# Patient Record
Sex: Male | Born: 1955 | Race: Black or African American | Hispanic: No | Marital: Married | Smoking: Current every day smoker
Health system: Southern US, Community
[De-identification: ages and names within clinical notes are randomized; demographics above are authoritative.]

## PROBLEM LIST (undated history)

## (undated) DIAGNOSIS — J45909 Unspecified asthma, uncomplicated: Secondary | ICD-10-CM

## (undated) DIAGNOSIS — E785 Hyperlipidemia, unspecified: Secondary | ICD-10-CM

## (undated) DIAGNOSIS — I10 Essential (primary) hypertension: Secondary | ICD-10-CM

## (undated) DIAGNOSIS — R0600 Dyspnea, unspecified: Secondary | ICD-10-CM

## (undated) HISTORY — DX: Hyperlipidemia, unspecified: E78.5

## (undated) HISTORY — DX: Unspecified asthma, uncomplicated: J45.909

---

## 2019-09-15 ENCOUNTER — Encounter: Payer: Self-pay | Admitting: Family Medicine

## 2019-09-15 ENCOUNTER — Other Ambulatory Visit: Payer: Self-pay

## 2019-09-15 ENCOUNTER — Ambulatory Visit: Payer: Self-pay | Admitting: Family Medicine

## 2019-09-15 DIAGNOSIS — Z5321 Procedure and treatment not carried out due to patient leaving prior to being seen by health care provider: Secondary | ICD-10-CM

## 2019-09-15 NOTE — Progress Notes (Signed)
Here today for STD screening. Accepts bloodwork. Isabellarose Kope, RN ° °

## 2019-09-15 NOTE — Progress Notes (Signed)
Client left before  His interview and exam.

## 2019-09-16 ENCOUNTER — Ambulatory Visit: Payer: Self-pay | Admitting: Family Medicine

## 2019-09-16 ENCOUNTER — Other Ambulatory Visit: Payer: Self-pay

## 2019-09-16 ENCOUNTER — Emergency Department
Admission: EM | Admit: 2019-09-16 | Discharge: 2019-09-16 | Disposition: A | Discharge status: Home / Self Care | Payer: Self-pay | Attending: Student in an Organized Health Care Education/Training Program | Admitting: Student in an Organized Health Care Education/Training Program

## 2019-09-16 DIAGNOSIS — Z5321 Procedure and treatment not carried out due to patient leaving prior to being seen by health care provider: Secondary | ICD-10-CM

## 2019-09-16 DIAGNOSIS — Z760 Encounter for issue of repeat prescription: Secondary | ICD-10-CM | POA: Insufficient documentation

## 2019-09-16 HISTORY — DX: Essential (primary) hypertension: I10

## 2019-09-16 MED ORDER — ALBUTEROL SULFATE HFA 108 (90 BASE) MCG/ACT IN AERS
2.0000 | INHALATION_SPRAY | Freq: Four times a day (QID) | RESPIRATORY_TRACT | 2 refills | Status: DC | PRN
Start: 2019-09-16 — End: 2020-04-13

## 2019-09-16 NOTE — Progress Notes (Signed)
Client left clinic without being seen.

## 2019-09-16 NOTE — ED Triage Notes (Signed)
Pt here for an inhaler for his asthma. Pt was recently released from prison this month and does not have a way of getting his medication. Pt NAD in triage.

## 2019-09-16 NOTE — ED Provider Notes (Signed)
[facility] Emergency Department Provider Note   ____________________________________________   First MD Initiated Contact with Patient 09/16/19 1651     (approximate)  I have reviewed the triage vital signs and the nursing notes.   HISTORY  Chief Complaint Medication Assistance    HPI Michael Chaney is a 64 y.o. male patient requests prescription refill for Ventolin.  Patient recently released from prison and did not have his inhalers.  Patient denies chest pain, wheezing,  or shortness of breath at this time.          Past Medical History:  Diagnosis Date  . Hypertension     There are no problems to display for this patient.     Prior to Admission medications   Medication Sig Start Date End Date Taking? Authorizing Provider  albuterol (VENTOLIN HFA) 108 (90 Base) MCG/ACT inhaler Inhale 2 puffs into the lungs every 6 (six) hours as needed for wheezing or shortness of breath. 09/16/19   Joni Reining, PA-C    Allergies Bee venom  No family history on file.  Social History Social History   Tobacco Use  . Smoking status: Former Smoker    Types: Cigarettes  . Smokeless tobacco: Never Used  Substance Use Topics  . Alcohol use: Not on file  . Drug use: Not on file    Review of Systems Constitutional: No fever/chills Eyes: No visual changes. ENT: No sore throat. Cardiovascular: Denies chest pain. Respiratory: Denies shortness of breath. Gastrointestinal: No abdominal pain.  No nausea, no vomiting.  No diarrhea.  No constipation. Genitourinary: Negative for dysuria. Musculoskeletal: Negative for back pain. Skin: Negative for rash. Neurological: Negative for headaches, focal weakness or numbness. Endocrine:  Hypertension Allergic/Immunilogical: Bee stings  ____________________________________________   PHYSICAL EXAM:  VITAL SIGNS: ED Triage Vitals  Enc Vitals Group     BP 09/16/19 1607 (!) 171/103     Pulse Rate  09/16/19 1607 (!) 59     Resp 09/16/19 1607 18     Temp 09/16/19 1607 98.9 F (37.2 C)     Temp Source 09/16/19 1607 Oral     SpO2 09/16/19 1607 99 %     Weight 09/16/19 1608 194 lb (88 kg)     Height 09/16/19 1608 5\' 11"  (1.803 m)     Head Circumference --      Peak Flow --      Pain Score 09/16/19 1608 0     Pain Loc --      Pain Edu? --      Excl. in GC? --    Constitutional: Alert and oriented. Well appearing and in no acute distress. Cardiovascular: Normal rate, regular rhythm. Grossly normal heart sounds.  Good peripheral circulation.  Elevated blood pressure. Respiratory: Normal respiratory effort.  No retractions. Lungs CTAB. Neurologic:  Normal speech and language. No gross focal neurologic deficits are appreciated. No gait instability. Skin:  Skin is warm, dry and intact. No rash noted. Psychiatric: Mood and affect are normal. Speech and behavior are normal.  ____________________________________________   LABS (all labs ordered are listed, but only abnormal results are displayed)  Labs Reviewed - No data to display ____________________________________________  EKG   ____________________________________________  RADIOLOGY  ED MD interpretation:    Official radiology report(s): No results found.  ____________________________________________   PROCEDURES  Procedure(s) performed (including Critical Care):  Procedures   ____________________________________________   INITIAL IMPRESSION / ASSESSMENT AND PLAN / ED COURSE  As part of my medical decision making,  I reviewed the following data within the [facility]     Patient presents for medication refill of albuterol inhaler.  Patient given discharge care instruction advised [facility] with [facility].  Patient given prescription for Ventolin with 2 refills.    Zaul Hubers was evaluated in Emergency Department on 09/16/2019 for the symptoms described in the history of  present illness. He was evaluated in the context of the global COVID-19 pandemic, which necessitated consideration that the patient might be at risk for infection with the SARS-CoV-2 virus that causes COVID-19. Institutional protocols and algorithms that pertain to the evaluation of patients at risk for COVID-19 are in a state of rapid change based on information released by regulatory bodies including the CDC and federal and state organizations. These policies and algorithms were followed during the patient's care in the ED.       ____________________________________________   FINAL CLINICAL IMPRESSION(S) / ED DIAGNOSES  Final diagnoses:  Encounter for medication refill     ED Discharge Orders         Ordered    albuterol (VENTOLIN HFA) 108 (90 Base) MCG/ACT inhaler  Every 6 hours PRN     Discontinue  Reprint     09/16/19 1702           Note:  This document was prepared using Dragon voice recognition software and may include unintentional dictation errors.    Sable Feil, PA-C 09/16/19 1709    Merlyn Lot, MD 09/16/19 1946

## 2019-09-16 NOTE — ED Notes (Signed)
See triage note  States he was recently released from jail  Requesting inhalers   States he was on albuterol and Dulera inhalers

## 2019-09-18 ENCOUNTER — Ambulatory Visit: Payer: Self-pay | Admitting: Internal Medicine

## 2019-10-13 ENCOUNTER — Telehealth: Payer: Self-pay | Admitting: General Practice

## 2019-10-13 NOTE — Telephone Encounter (Signed)
Individual has been contacted 3+ times regarding ED referral. No further attempts to contact individual will be made. 

## 2019-10-24 ENCOUNTER — Encounter: Payer: Self-pay | Admitting: Emergency Medicine

## 2019-10-24 ENCOUNTER — Emergency Department
Admission: EM | Admit: 2019-10-24 | Discharge: 2019-10-24 | Disposition: A | Discharge status: Home / Self Care | Payer: Self-pay | Attending: Emergency Medicine | Admitting: Emergency Medicine

## 2019-10-24 ENCOUNTER — Other Ambulatory Visit: Payer: Self-pay

## 2019-10-24 DIAGNOSIS — R197 Diarrhea, unspecified: Secondary | ICD-10-CM | POA: Insufficient documentation

## 2019-10-24 DIAGNOSIS — I10 Essential (primary) hypertension: Secondary | ICD-10-CM | POA: Insufficient documentation

## 2019-10-24 DIAGNOSIS — Z87891 Personal history of nicotine dependence: Secondary | ICD-10-CM | POA: Insufficient documentation

## 2019-10-24 MED ORDER — DIPHENOXYLATE-ATROPINE 2.5-0.025 MG PO TABS
2.0000 | ORAL_TABLET | Freq: Once | ORAL | Status: AC
  Administered 2019-10-24: 2 via ORAL
  Filled 2019-10-24: qty 2

## 2019-10-24 MED ORDER — DICYCLOMINE HCL 10 MG PO CAPS
10.0000 mg | ORAL_CAPSULE | Freq: Once | ORAL | Status: AC
  Administered 2019-10-24: 10 mg via ORAL
  Filled 2019-10-24: qty 1

## 2019-10-24 MED ORDER — LOPERAMIDE HCL 2 MG PO TABS
2.0000 mg | ORAL_TABLET | Freq: Four times a day (QID) | ORAL | 0 refills | Status: DC | PRN
Start: 2019-10-24 — End: 2019-11-12

## 2019-10-24 NOTE — ED Triage Notes (Signed)
Pt to ED via POV c/o upset stomach. Pt states that he has had diarrhea since this morning. Pt states that he had 3 episodes of diarrhea. Pt denies abdominal pain. Pt is in NAD.

## 2019-10-24 NOTE — ED Provider Notes (Signed)
[facility] Emergency Department Provider Note   ____________________________________________   First MD Initiated Contact with Patient 10/24/19 1226     (approximate)  I have reviewed the triage vital signs and the nursing notes.   HISTORY  Chief Complaint Diarrhea    HPI Michael Chaney is a 64 y.o. male patient states 3 episodes of loose stools in the last 12 hours.  Patient believes he might ate some that upset his stomach.  Patient denies nausea or abdominal pain.  Patient states he did not notice any stool on tissue paper.         Past Medical History:  Diagnosis Date  . Hypertension     There are no problems to display for this patient.   History reviewed. No pertinent surgical history.  Prior to Admission medications   Medication Sig Start Date End Date Taking? Authorizing Provider  albuterol (VENTOLIN HFA) 108 (90 Base) MCG/ACT inhaler Inhale 2 puffs into the lungs every 6 (six) hours as needed for wheezing or shortness of breath. 09/16/19   Joni Reining, PA-C  loperamide (IMODIUM A-D) 2 MG tablet Take 1 tablet (2 mg total) by mouth 4 (four) times daily as needed for diarrhea or loose stools. 10/24/19   Joni Reining, PA-C    Allergies Bee venom  No family history on file.  Social History Social History   Tobacco Use  . Smoking status: Former Smoker    Types: Cigarettes  . Smokeless tobacco: Never Used  Substance Use Topics  . Alcohol use: Not Currently  . Drug use: Not Currently    Review of Systems Constitutional: No fever/chills Eyes: No visual changes. ENT: No sore throat. Cardiovascular: Denies chest pain. Respiratory: Denies shortness of breath. Gastrointestinal: No abdominal pain.  No nausea, no vomiting.  3 episodes of loose stools..  No constipation. Genitourinary: Negative for dysuria. Musculoskeletal: Negative for back pain. Skin: Negative for rash. Neurological: Negative for headaches, focal  weakness or numbness. Allergic/Immunilogical: Bee sting. ____________________________________________   PHYSICAL EXAM:  VITAL SIGNS: ED Triage Vitals  Enc Vitals Group     BP 10/24/19 1154 (!) 155/94     Pulse Rate 10/24/19 1154 74     Resp 10/24/19 1154 16     Temp 10/24/19 1154 98.4 F (36.9 C)     Temp Source 10/24/19 1154 Oral     SpO2 10/24/19 1154 97 %     Weight 10/24/19 1155 194 lb (88 kg)     Height 10/24/19 1155 5\' 11"  (1.803 m)     Head Circumference --      Peak Flow --      Pain Score 10/24/19 1156 0     Pain Loc --      Pain Edu? --      Excl. in GC? --    Constitutional: Alert and oriented. Well appearing and in no acute distress. Mouth/Throat: Mucous membranes are moist.  Oropharynx non-erythematous. Cardiovascular: Normal rate, regular rhythm. Grossly normal heart sounds.  Good peripheral circulation.  Elevated blood pressure. Respiratory: Normal respiratory effort.  No retractions. Lungs CTAB. Gastrointestinal: Hyperactive bowel sounds.  Soft and nontender. No distention. No abdominal bruits. No CVA tenderness. ____________________________________________   LABS (all labs ordered are listed, but only abnormal results are displayed)  Labs Reviewed - No data to display ____________________________________________  EKG   ____________________________________________  RADIOLOGY  ED MD interpretation:    Official radiology report(s): No results found.  ____________________________________________   PROCEDURES  Procedure(s) performed (  including Critical Care):  Procedures   ____________________________________________   INITIAL IMPRESSION / ASSESSMENT AND PLAN / ED COURSE  As part of my medical decision making, I reviewed the following data within the electronic MEDICAL RECORD NUMBER     Patient presents with 3 episodes of loose stools secondary to spicy food last night.  Patient denies blood in stools.  Patient denies abdominal pain.   Physical exam is grossly unremarkable.  Patient given discharge care instruction prescription for Imodium.  Patient advised return to ED if condition worsens.   Michael Chaney was evaluated in Emergency Department on 10/24/2019 for the symptoms described in the history of present illness. He was evaluated in the context of the global COVID-19 pandemic, which necessitated consideration that the patient might be at risk for infection with the SARS-CoV-2 virus that causes COVID-19. Institutional protocols and algorithms that pertain to the evaluation of patients at risk for COVID-19 are in a state of rapid change based on information released by regulatory bodies including the CDC and federal and state organizations. These policies and algorithms were followed during the patient's care in the ED.       ____________________________________________   FINAL CLINICAL IMPRESSION(S) / ED DIAGNOSES  Final diagnoses:  Diarrhea in adult patient     ED Discharge Orders         Ordered    loperamide (IMODIUM A-D) 2 MG tablet  4 times daily PRN     Discontinue  Reprint     10/24/19 1249           Note:  This document was prepared using Dragon voice recognition software and may include unintentional dictation errors.    Joni Reining, PA-C 10/24/19 1257    Sharyn Creamer, MD 10/24/19 [phone]

## 2019-10-24 NOTE — Discharge Instructions (Signed)
Follow discharge care instruction take medication as directed. °

## 2019-10-24 NOTE — ED Notes (Signed)
See triage note  Presents with some stomach discomfort   States he thinks he ate something bad yesterday  States he has had 3 stools today  But did not describes as diarrhea  No fever

## 2019-10-25 ENCOUNTER — Other Ambulatory Visit: Payer: Self-pay

## 2019-10-25 ENCOUNTER — Emergency Department
Admission: EM | Admit: 2019-10-25 | Discharge: 2019-10-25 | Disposition: A | Discharge status: Home / Self Care | Payer: Self-pay | Attending: Emergency Medicine | Admitting: Emergency Medicine

## 2019-10-25 ENCOUNTER — Emergency Department: Payer: Self-pay

## 2019-10-25 ENCOUNTER — Encounter: Payer: Self-pay | Admitting: Emergency Medicine

## 2019-10-25 DIAGNOSIS — Z7951 Long term (current) use of inhaled steroids: Secondary | ICD-10-CM | POA: Insufficient documentation

## 2019-10-25 DIAGNOSIS — Z79899 Other long term (current) drug therapy: Secondary | ICD-10-CM | POA: Insufficient documentation

## 2019-10-25 DIAGNOSIS — Z87891 Personal history of nicotine dependence: Secondary | ICD-10-CM | POA: Insufficient documentation

## 2019-10-25 DIAGNOSIS — M5442 Lumbago with sciatica, left side: Secondary | ICD-10-CM | POA: Insufficient documentation

## 2019-10-25 DIAGNOSIS — I10 Essential (primary) hypertension: Secondary | ICD-10-CM | POA: Insufficient documentation

## 2019-10-25 MED ORDER — CYCLOBENZAPRINE HCL 10 MG PO TABS
10.0000 mg | ORAL_TABLET | Freq: Three times a day (TID) | ORAL | 0 refills | Status: DC | PRN
Start: 2019-10-25 — End: 2019-11-12

## 2019-10-25 MED ORDER — MELOXICAM 15 MG PO TABS
15.0000 mg | ORAL_TABLET | Freq: Every day | ORAL | 2 refills | Status: DC
Start: 2019-10-25 — End: 2020-03-02

## 2019-10-25 NOTE — ED Provider Notes (Signed)
[facility] Emergency Department Provider Note  ____________________________________________   First MD Initiated Contact with Patient 10/25/19 1438     (approximate)  I have reviewed the triage vital signs and the nursing notes.   HISTORY  Chief Complaint Back Pain    HPI Michael Chaney is a 64 y.o. 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 Hypertension     There are no problems to display for this patient.   History reviewed. No pertinent surgical history.  Prior to Admission medications   Medication Sig Start Date End Date Taking? Authorizing Provider  albuterol (VENTOLIN HFA) 108 (90 Base) MCG/ACT inhaler Inhale 2 puffs into the lungs every 6 (six) hours as needed for wheezing or shortness of breath. 09/16/19   Joni Reining, PA-C  cyclobenzaprine (FLEXERIL) 10 MG tablet Take 1 tablet (10 mg total) by mouth 3 (three) times daily as needed. 10/25/19   Anntonette Madewell, Roselyn Bering, PA-C  loperamide (IMODIUM A-D) 2 MG tablet Take 1 tablet (2 mg total) by mouth 4 (four) times daily as needed for diarrhea or loose stools. 10/24/19   Joni Reining, PA-C  meloxicam (MOBIC) 15 MG tablet Take 1 tablet (15 mg total) by mouth daily. 10/25/19 10/24/20  Sherrie Mustache Roselyn Bering, PA-C    Allergies Bee venom  History reviewed. No pertinent family history.  Social History Social History   Tobacco Use  . Smoking status: Former Smoker    Types: Cigarettes  . Smokeless tobacco: Never Used  Substance Use Topics  . Alcohol use: Not Currently  . Drug use: Not Currently    Review of Systems  Constitutional: No fever/chills Eyes: No  visual changes. ENT: No sore throat. Respiratory: Denies cough Genitourinary: Negative for dysuria. Musculoskeletal: Positive for back pain. Skin: Negative for rash.    ____________________________________________   PHYSICAL EXAM:  VITAL SIGNS: ED Triage Vitals  Enc Vitals Group     BP 10/25/19 1352 (!) 132/78     Pulse Rate 10/25/19 1352 77     Resp 10/25/19 1352 18     Temp 10/25/19 1352 98.7 F (37.1 C)     Temp Source 10/25/19 1352 Oral     SpO2 10/25/19 1352 96 %     Weight 10/25/19 1352 194 lb (88 kg)     Height 10/25/19 1352 5\' 11"  (1.803 m)     Head Circumference --      Peak Flow --      Pain Score 10/25/19 1357 10     Pain Loc --      Pain Edu? --      Excl. in GC? --     Constitutional: Alert and oriented. Well appearing and in no acute distress. Eyes: Conjunctivae are normal.  Head: Atraumatic. Nose: No congestion/rhinnorhea. Mouth/Throat: Mucous membranes are moist.   Neck:  supple no lymphadenopathy noted Cardiovascular: Normal rate, regular rhythm. Heart sounds are normal Respiratory: Normal respiratory effort.  No retractions, lungs c t a  GU: deferred Musculoskeletal: FROM all extremities, warm and well perfused.  Decreased rom of back due to discomfort, lumbar spine minimally tender, negative slr, 5 out of  5 strength in great toes b/l, 5 out of 5 strength in lower legs, n/v intact Neurologic:  Normal speech and language.  Skin:  Skin is warm, dry and intact. No rash noted. Psychiatric: Mood and affect are normal. Speech and behavior are normal.  ____________________________________________   LABS (all labs ordered are listed, but only abnormal results are displayed)  Labs Reviewed - No data to display ____________________________________________   ____________________________________________  RADIOLOGY  X-ray lumbar spine is negative for any acute abnormality  ____________________________________________   PROCEDURES  Procedure(s)  performed: No  Procedures    ____________________________________________   INITIAL IMPRESSION / ASSESSMENT AND PLAN / ED COURSE  Pertinent labs & imaging results that were available during my care of the patient were reviewed by me and considered in my medical decision making (see chart for details).   Patient is a 64 year old male presents emergency department with low back pain which radiates to the left thigh.  See HPI  Physical exam shows patient to appear well.  Lumbar spine is minimally tender.  SI joint is tender, remainder of exam is unremarkable  X-ray lumbar spine does not show any acute abnormality  Explained findings to patient.  Is given a prescription for meloxicam and Flexeril.  Follow-up with his regular doctor if not improving or orthopedics.  He is given a work note as requested.  He is discharged stable condition.     As part of my medical decision making, I reviewed the following data within the electronic MEDICAL RECORD NUMBER Nursing notes reviewed and incorporated, Old chart reviewed, Radiograph reviewed , Notes from prior ED visits and [place] Controlled Substance Database  ____________________________________________   FINAL CLINICAL IMPRESSION(S) / ED DIAGNOSES  Final diagnoses:  Acute midline low back pain with left-sided sciatica      NEW MEDICATIONS STARTED DURING THIS VISIT:  New Prescriptions   CYCLOBENZAPRINE (FLEXERIL) 10 MG TABLET    Take 1 tablet (10 mg total) by mouth 3 (three) times daily as needed.   MELOXICAM (MOBIC) 15 MG TABLET    Take 1 tablet (15 mg total) by mouth daily.     Note:  This document was prepared using Dragon voice recognition software and may include unintentional dictation errors.     Faythe Ghee, PA-C 10/25/19 1602    Delton Prairie, MD 10/26/19 [phone]

## 2019-10-25 NOTE — Discharge Instructions (Signed)
Follow-up with your regular doctor or [facility] clinic orthopedics if not improving in 1 week.  Use medication as prescribed.  Apply ice to the lower back.  Return if worsening

## 2019-10-25 NOTE — ED Notes (Signed)
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 Pt states he normally uses his brother's "arthritis pain rub" but it did not help today. Pt states tylenol/otc measures do not work for him. Pt is ambulatory w/ a limp.

## 2019-10-25 NOTE — ED Triage Notes (Signed)
Pt presents to ED via POV with c/o L lower back pain, pt states at some points has been ongoing, at some points that it started today. Pt states pain radiates down the back of his leg at this time.   Pt states recently started a new job and attributes his lower back pain to his new job.

## 2019-11-02 ENCOUNTER — Emergency Department: Payer: Self-pay

## 2019-11-02 ENCOUNTER — Other Ambulatory Visit: Payer: Self-pay

## 2019-11-02 ENCOUNTER — Encounter: Payer: Self-pay | Admitting: Emergency Medicine

## 2019-11-02 ENCOUNTER — Emergency Department
Admission: EM | Admit: 2019-11-02 | Discharge: 2019-11-02 | Disposition: A | Discharge status: Home / Self Care | Payer: Self-pay | Attending: Emergency Medicine | Admitting: Emergency Medicine

## 2019-11-02 DIAGNOSIS — M1712 Unilateral primary osteoarthritis, left knee: Secondary | ICD-10-CM | POA: Insufficient documentation

## 2019-11-02 DIAGNOSIS — M545 Low back pain: Secondary | ICD-10-CM | POA: Insufficient documentation

## 2019-11-02 DIAGNOSIS — R202 Paresthesia of skin: Secondary | ICD-10-CM | POA: Insufficient documentation

## 2019-11-02 DIAGNOSIS — I10 Essential (primary) hypertension: Secondary | ICD-10-CM | POA: Insufficient documentation

## 2019-11-02 DIAGNOSIS — Z79899 Other long term (current) drug therapy: Secondary | ICD-10-CM | POA: Insufficient documentation

## 2019-11-02 DIAGNOSIS — Z7951 Long term (current) use of inhaled steroids: Secondary | ICD-10-CM | POA: Insufficient documentation

## 2019-11-02 DIAGNOSIS — M5432 Sciatica, left side: Secondary | ICD-10-CM | POA: Insufficient documentation

## 2019-11-02 DIAGNOSIS — Z87891 Personal history of nicotine dependence: Secondary | ICD-10-CM | POA: Insufficient documentation

## 2019-11-02 MED ORDER — AMLODIPINE BESYLATE 5 MG PO TABS: 5.0000 mg | ORAL_TABLET | Freq: Every day | ORAL | 0 refills | Status: DC

## 2019-11-02 MED ORDER — GABAPENTIN 100 MG PO CAPS: 100.0000 mg | ORAL_CAPSULE | Freq: Three times a day (TID) | ORAL | 0 refills | Status: DC

## 2019-11-02 NOTE — ED Triage Notes (Signed)
Patient presents to the ED with left lower leg/knee pain and numbness that began over 1 week ago.  Patient states he is working at [employer] and the temp inside is 36 degrees and he has to step up and down a lot, works long shifts and has to lift a lot as well.  Patient states he started the job approx. 1 month ago.

## 2019-11-02 NOTE — ED Provider Notes (Signed)
[facility] Emergency Department Provider Note  ____________________________________________   First MD Initiated Contact with Patient 11/02/19 1151     (approximate)  I have reviewed the triage vital signs and the nursing notes.   HISTORY  Chief Complaint Leg Pain   HPI Michael Chaney is a 64 y.o. male patient is a 64 year old male with a past medical history of left lower extremity sciatic pain and hypertension who presents for assessment of persistent left lower extremity pain and some intermittent numbness over the left medial calf over the last 3 weeks since starting a new job where he has been given 12 hours on feet.  Patient denies any falls or injuries.  No clear alleviating aggravating factors other than standing on his feet for long periods of time seems to aggravate the pain.  He denies any pain numbness or weakness in his left foot, ankle, or hip.  He has tried OTC medications but he does not help.  Denies any fevers, chills, cough, nausea, vomiting, diarrhea, dysuria, incontinence, right lower extremity pain, or other acute complaints.  Denies illegal drug use or EtOH abuse.  Similar to prior pain he has had in the past.           Past Medical History:  Diagnosis Date  . Hypertension     There are no problems to display for this patient.   History reviewed. No pertinent surgical history.  Prior to Admission medications   Medication Sig Start Date End Date Taking? Authorizing Provider  albuterol (VENTOLIN HFA) 108 (90 Base) MCG/ACT inhaler Inhale 2 puffs into the lungs every 6 (six) hours as needed for wheezing or shortness of breath. 09/16/19   Joni Reining, PA-C  amLODipine (NORVASC) 5 MG tablet Take 1 tablet (5 mg total) by mouth daily. 11/02/19 12/02/19  Fisher, Roselyn Bering, PA-C  cyclobenzaprine (FLEXERIL) 10 MG tablet Take 1 tablet (10 mg total) by mouth 3 (three) times daily as needed. 10/25/19   Fisher, Roselyn Bering, PA-C  gabapentin  (NEURONTIN) 100 MG capsule Take 1 capsule (100 mg total) by mouth 3 (three) times daily for 14 days. 11/02/19 11/16/19  Fisher, Roselyn Bering, PA-C  loperamide (IMODIUM A-D) 2 MG tablet Take 1 tablet (2 mg total) by mouth 4 (four) times daily as needed for diarrhea or loose stools. 10/24/19   Joni Reining, PA-C  meloxicam (MOBIC) 15 MG tablet Take 1 tablet (15 mg total) by mouth daily. 10/25/19 10/24/20  Faythe Ghee, PA-C    Allergies Bee venom  No family history on file.  Social History Social History   Tobacco Use  . Smoking status: Former Smoker    Types: Cigarettes  . Smokeless tobacco: Never Used  Substance Use Topics  . Alcohol use: Not Currently  . Drug use: Not Currently    Review of Systems  Review of Systems  Constitutional: Negative for chills and fever.  HENT: Negative for sore throat.   Eyes: Negative for pain.  Respiratory: Negative for cough and stridor.   Cardiovascular: Negative for chest pain.  Gastrointestinal: Negative for vomiting.  Musculoskeletal: Positive for back pain ( Left lower radiating down L leg) and joint pain ( L knee). Negative for neck pain.  Skin: Negative for rash.  Neurological: Negative for seizures, loss of consciousness and headaches.  Psychiatric/Behavioral: Negative for suicidal ideas.  All other systems reviewed and are negative.     ____________________________________________   PHYSICAL EXAM:  VITAL SIGNS: ED Triage Vitals  Enc Vitals  Group     BP 11/02/19 0850 (!) 160/111     Pulse Rate 11/02/19 0850 85     Resp 11/02/19 0850 17     Temp 11/02/19 0850 98.6 F (37 C)     Temp Source 11/02/19 0850 Oral     SpO2 11/02/19 0850 99 %     Weight 11/02/19 0857 192 lb (87.1 kg)     Height 11/02/19 0857 5\' 11"  (1.803 m)     Head Circumference --      Peak Flow --      Pain Score 11/02/19 0857 5     Pain Loc --      Pain Edu? --      Excl. in GC? --    Vitals:   11/02/19 0850  BP: (!) 160/111  Pulse: 85  Resp: 17  Temp:  98.6 F (37 C)  SpO2: 99%   Physical Exam Vitals and nursing note reviewed.  Constitutional:      Appearance: Normal appearance. He is well-developed and normal weight.  HENT:     Head: Normocephalic and atraumatic.     Right Ear: External ear normal.     Left Ear: External ear normal.     Nose: Nose normal.     Mouth/Throat:     Mouth: Mucous membranes are moist.  Eyes:     Conjunctiva/sclera: Conjunctivae normal.  Cardiovascular:     Rate and Rhythm: Normal rate and regular rhythm.     Heart sounds: No murmur heard.   Pulmonary:     Effort: Pulmonary effort is normal. No respiratory distress.     Breath sounds: Normal breath sounds.  Abdominal:     Palpations: Abdomen is soft.     Tenderness: There is no abdominal tenderness.  Musculoskeletal:     Cervical back: Neck supple.     Right lower leg: No edema.     Left lower leg: No edema.  Skin:    General: Skin is warm and dry.     Capillary Refill: Capillary refill takes less than 2 seconds.  Neurological:     General: No focal deficit present.     Mental Status: He is alert.  Psychiatric:        Mood and Affect: Mood normal.     There is no effusion or deformity at the left hip or knee.  2+ DP pulse.  Sensation intact light touch throughout the left lower extremity.  There is no effusion, edema, erythema, or warmth about the left hip, knee, or ankle.  Patient is 5/5 strength at all extremities.  In the area the patient states he is numb he does have some sensation but describes it is different is most consistent with some paresthesias.  Positive left-sided straight leg test.  Right lower extremities unremarkable.  No overlying skin changes of the patient's back or lumbar spinous process tenderness. ____________________________________________   LABS (all labs ordered are listed, but only abnormal results are displayed)  Labs Reviewed - No data to  display ____________________________________________  ____________________________________________  RADIOLOGY  ED MD interpretation: No evidence of fracture dislocation but there is evidence of osteoarthritis.  Official radiology report(s): DG Knee 2 Views Left  Result Date: 11/02/2019 CLINICAL DATA:  Knee pain EXAM: LEFT KNEE - 1-2 VIEW COMPARISON:  None. FINDINGS: Alignment is anatomic. No fracture. No joint effusion. Chondrocalcinosis. Mild changes of osteoarthritis. IMPRESSION: Mild osteoarthritis. Electronically Signed   By: 01/02/2020 M.D.   On: 11/02/2019 12:36  ____________________________________________   PROCEDURES  Procedure(s) performed (including Critical Care):  Procedures   ____________________________________________   INITIAL IMPRESSION / ASSESSMENT AND PLAN / ED COURSE        Number patient's history, exam, and ED work-up is consistent with likely multifactorial lower back and left lower extremity pain.  Patient has known left lower extremity sciatic pain and he does have evidence on x-ray of arthritis in his left knee.  Patient is neurovascularly intact and there is no history or exam findings to suggest acute traumatic injury or acute infectious process.  Exam is not consistent with DVT.  I discussed with the patient my concern for above-noted pathologies recommendation for OTC Tylenol and ibuprofen as well as close outpatient follow-up.  Also discussed with patient that his blood pressure was elevated today and I will write short course of amlodipine but that he will require recheck of his blood pressure and possible additional medications from his PCP as well.  Rx written for amlodipine and Neurontin due to concern for possible neuropathic component of his pain.  Patient discharged stable condition.  Strict return precautions advised and discussed.  Patient was noted to ambulate with steady gait unassisted emergency room.  Medications - No data to  display           ____________________________________________   FINAL CLINICAL IMPRESSION(S) / ED DIAGNOSES  Final diagnoses:  Arthritis of left knee  Left sciatic nerve pain  Hypertension, unspecified type     ED Discharge Orders         Ordered    gabapentin (NEURONTIN) 100 MG capsule  3 times daily,   Status:  Discontinued     Reprint     11/02/19 1241    amLODipine (NORVASC) 5 MG tablet  Daily,   Status:  Discontinued     Reprint     11/02/19 1241    gabapentin (NEURONTIN) 100 MG capsule  3 times daily     Discontinue  Reprint     11/02/19 1256    amLODipine (NORVASC) 5 MG tablet  Daily     Discontinue  Reprint     11/02/19 1256           Note:  This document was prepared using Dragon voice recognition software and may include unintentional dictation errors.   Gilles Chiquito, MD 11/02/19 [phone]

## 2019-11-11 ENCOUNTER — Emergency Department: Admission: EM | Admit: 2019-11-11 | Discharge: 2019-11-11 | Discharge status: Against Medical Advice | Payer: Self-pay

## 2019-11-12 ENCOUNTER — Other Ambulatory Visit: Payer: Self-pay

## 2019-11-12 ENCOUNTER — Emergency Department
Admission: EM | Admit: 2019-11-12 | Discharge: 2019-11-12 | Disposition: A | Discharge status: Home / Self Care | Payer: Self-pay | Attending: Emergency Medicine | Admitting: Emergency Medicine

## 2019-11-12 ENCOUNTER — Emergency Department: Payer: Self-pay

## 2019-11-12 ENCOUNTER — Encounter: Payer: Self-pay | Admitting: Emergency Medicine

## 2019-11-12 DIAGNOSIS — I10 Essential (primary) hypertension: Secondary | ICD-10-CM | POA: Insufficient documentation

## 2019-11-12 DIAGNOSIS — Z87891 Personal history of nicotine dependence: Secondary | ICD-10-CM | POA: Insufficient documentation

## 2019-11-12 DIAGNOSIS — Z7951 Long term (current) use of inhaled steroids: Secondary | ICD-10-CM | POA: Insufficient documentation

## 2019-11-12 DIAGNOSIS — M25562 Pain in left knee: Secondary | ICD-10-CM | POA: Insufficient documentation

## 2019-11-12 DIAGNOSIS — M159 Polyosteoarthritis, unspecified: Secondary | ICD-10-CM | POA: Insufficient documentation

## 2019-11-12 DIAGNOSIS — R52 Pain, unspecified: Secondary | ICD-10-CM

## 2019-11-12 DIAGNOSIS — Z79899 Other long term (current) drug therapy: Secondary | ICD-10-CM | POA: Insufficient documentation

## 2019-11-12 MED ORDER — KETOROLAC TROMETHAMINE 30 MG/ML IJ SOLN
30.0000 mg | Freq: Once | INTRAMUSCULAR | Status: AC
  Administered 2019-11-12: 30 mg via INTRAMUSCULAR
  Filled 2019-11-12: qty 1

## 2019-11-12 NOTE — ED Notes (Signed)
See triage note  Presents with left knee pain  Denies recent I jury  Ambulates with slight limp

## 2019-11-12 NOTE — ED Triage Notes (Addendum)
Patient ambulatory to triage with steady gait, without difficulty or distress noted; pt st "I went to work and was sitting under a tree and it gave out on me"; st hx of same and was told he has arthritis in his knee

## 2019-11-12 NOTE — ED Provider Notes (Signed)
[facility] Emergency Department Provider Note   ____________________________________________   First MD Initiated Contact with Patient 11/12/19 0815     (approximate)  I have reviewed the triage vital signs and the nursing notes.   HISTORY  Chief Complaint Knee Pain  HPI Michael Chaney is a 64 y.o. male presents to the ED with complaint of left knee pain.  Patient denies any actual injury and states that he was "sitting under a tree" when his knee gave out.  He also complains of left hip pain and states he has a history of "arthritis".  Patient reports that he continues to take amlodipine 5 mg daily for hypertension, gabapentin 100 mg 3 times daily and is unsure whether or not he is taking the meloxicam.  Rates pain as 6 out of 10.       Past Medical History:  Diagnosis Date  . Hypertension     There are no problems to display for this patient.   History reviewed. No pertinent surgical history.  Prior to Admission medications   Medication Sig Start Date End Date Taking? Authorizing Provider  albuterol (VENTOLIN HFA) 108 (90 Base) MCG/ACT inhaler Inhale 2 puffs into the lungs every 6 (six) hours as needed for wheezing or shortness of breath. 09/16/19   Joni Reining, PA-C  amLODipine (NORVASC) 5 MG tablet Take 1 tablet (5 mg total) by mouth daily. 11/02/19 12/02/19  Fisher, Roselyn Bering, PA-C  gabapentin (NEURONTIN) 100 MG capsule Take 1 capsule (100 mg total) by mouth 3 (three) times daily for 14 days. 11/02/19 11/16/19  Fisher, Roselyn Bering, PA-C  meloxicam (MOBIC) 15 MG tablet Take 1 tablet (15 mg total) by mouth daily. 10/25/19 10/24/20  Faythe Ghee, PA-C    Allergies Bee venom  No family history on file.  Social History Social History   Tobacco Use  . Smoking status: Former Smoker    Types: Cigarettes  . Smokeless tobacco: Never Used  Substance Use Topics  . Alcohol use: Not Currently  . Drug use: Not Currently    Review of  Systems Constitutional: No fever/chills Eyes: No visual changes. Cardiovascular: Denies chest pain. Respiratory: Denies shortness of breath. Gastrointestinal: No abdominal pain.  No nausea, no vomiting.  Musculoskeletal: Positive for left knee pain and left hip pain. Skin: Negative for rash. Neurological: Negative for headaches, focal weakness or numbness. ____________________________________________   PHYSICAL EXAM:  VITAL SIGNS: ED Triage Vitals  Enc Vitals Group     BP 11/12/19 0618 (!) 152/98     Pulse Rate 11/12/19 0618 94     Resp 11/12/19 0618 18     Temp 11/12/19 0618 99 F (37.2 C)     Temp Source 11/12/19 0618 Oral     SpO2 11/12/19 0618 98 %     Weight 11/12/19 0619 192 lb (87.1 kg)     Height 11/12/19 0619 5\' 11"  (1.803 m)     Head Circumference --      Peak Flow --      Pain Score 11/12/19 0623 6     Pain Loc --      Pain Edu? --      Excl. in GC? --     Constitutional: Alert and oriented. Well appearing and in no acute distress. Eyes: Conjunctivae are normal. PERRL. EOMI. Head: Atraumatic. Neck: No stridor.   Cardiovascular: Normal rate, regular rhythm. Grossly normal heart sounds.  Good peripheral circulation. Respiratory: Normal respiratory effort.  No retractions. Lungs CTAB. Musculoskeletal:  On examination of the left knee there is no gross deformity and patient is able to flex and extend with minimal discomfort and restriction. Patient does walk with a slight limp. There is no erythema or effusion noted while examining the joint. Patient also complains of tenderness with compression of the pelvis on the left side. No shortening or rotation of the lower extremities noted. Patient is able to stand and bear weight. Neurologic:  Normal speech and language. No gross focal neurologic deficits are appreciated. No gait instability. Skin:  Skin is warm, dry and intact. No rash noted. Psychiatric: Mood and affect are normal. Speech and behavior are  normal.  ____________________________________________   LABS (all labs ordered are listed, but only abnormal results are displayed)  Labs Reviewed - No data to display  RADIOLOGY   Official radiology report(s): DG Knee Complete 4 Views Left  Result Date: 11/12/2019 CLINICAL DATA:  Knee gave out. EXAM: LEFT KNEE - COMPLETE 4+ VIEW COMPARISON:  None. FINDINGS: No fracture or subluxation. No joint effusion. Chondrocalcinosis and mild degenerative spurring. Infrapatellar ossicle. Medial corticated ossicle at the distal femur. IMPRESSION: 1. No acute finding. 2. Chondrocalcinosis. Electronically Signed   By: Marnee Spring M.D.   On: 11/12/2019 07:01   DG HIP UNILAT WITH PELVIS 2-3 VIEWS LEFT  Result Date: 11/12/2019 CLINICAL DATA:  Pain EXAM: DG HIP (WITH OR WITHOUT PELVIS) 2-3V LEFT COMPARISON:  None. FINDINGS: Normal alignment. No fracture. No lytic or sclerotic osseous lesion. Right L5 hemisacralization. The joint spaces are approximated. Minimal bilateral acetabular degenerative spurring and subchondral sclerosis. No soft tissue abnormalities. IMPRESSION: Mild bilateral hip osteoarthritis.  No acute osseous abnormality. Right L5 hemisacralization. Electronically Signed   By: Stana Bunting M.D.   On: 11/12/2019 09:22    ____________________________________________   PROCEDURES  Procedure(s) performed (including Critical Care):  Procedures   ____________________________________________   INITIAL IMPRESSION / ASSESSMENT AND PLAN / ED COURSE  As part of my medical decision making, I reviewed the following data within the electronic MEDICAL RECORD NUMBER Notes from prior ED visits and [place] Controlled Substance Database  Michael Chaney was evaluated in Emergency Department on 11/12/2019 for the symptoms described in the history of present illness. He was evaluated in the context of the global COVID-19 pandemic, which necessitated consideration that the patient might be at risk for  infection with the SARS-CoV-2 virus that causes COVID-19. Institutional protocols and algorithms that pertain to the evaluation of patients at risk for COVID-19 are in a state of rapid change based on information released by regulatory bodies including the CDC and federal and state organizations. These policies and algorithms were followed during the patient's care in the ED.  64 year old male presents to the ED with complaint of left knee pain without known history of injury. Patient states he has been told that he has arthritis. Physical exam was benign and x-rays confirmed that he does have osteoarthritis bilateral hips and left knee. Patient has been using an over-the-counter cream that he states is helping. He is unsure if he continues to take the meloxicam that was prescribed for him at the first of the month. Patient states that he will check his medication bottles at home to see what he is taking. He is also encouraged to see what the cream medication he is using to see if there is any interaction with his current medication in case he is using Voltaren gel. He is encouraged to find a primary care provider and a list of clinics was  placed on his discharge papers.  ____________________________________________    FINAL CLINICAL IMPRESSION(S) / ED DIAGNOSES  Final diagnoses:  Acute pain of left knee  Osteoarthritis of multiple joints, unspecified osteoarthritis type     ED Discharge Orders    None       Note:  This document was prepared using Dragon voice recognition software and may include unintentional dictation errors.    Tommi Rumps, PA-C 11/12/19 1204    Delton Prairie, MD 11/12/19 [phone]

## 2019-11-12 NOTE — Discharge Instructions (Addendum)
Call all of the clinics listed on your discharge papers to see if they are taking new patients.  Also the phone number for the open-door clinic is listed on your discharge papers this is free clinic that you may be eligible for.  You will need to find a primary care doctor to manage your high blood pressure.  According to the prescriptions that were written 1 August you should have a prescription for amlodipine 5 mg 1 daily, gabapentin 100 mg 1 3 times a day and meloxicam 15 mg 1 daily with food.  You have refills left on the meloxicam for the next 2 months.  The meloxicam is your arthritis medicine.

## 2020-03-02 ENCOUNTER — Encounter: Payer: Self-pay | Admitting: Gerontology

## 2020-03-02 ENCOUNTER — Other Ambulatory Visit: Payer: Self-pay

## 2020-03-02 ENCOUNTER — Ambulatory Visit: Payer: Self-pay | Admitting: Gerontology

## 2020-03-02 VITALS — BP 154/89 | HR 87 | Resp 16 | Wt 180.5 lb

## 2020-03-02 DIAGNOSIS — Z7689 Persons encountering health services in other specified circumstances: Secondary | ICD-10-CM | POA: Insufficient documentation

## 2020-03-02 DIAGNOSIS — F17200 Nicotine dependence, unspecified, uncomplicated: Secondary | ICD-10-CM | POA: Insufficient documentation

## 2020-03-02 DIAGNOSIS — IMO0001 Reserved for inherently not codable concepts without codable children: Secondary | ICD-10-CM

## 2020-03-02 DIAGNOSIS — I10 Essential (primary) hypertension: Secondary | ICD-10-CM

## 2020-03-02 MED ORDER — AMLODIPINE BESYLATE 5 MG PO TABS: 5.0000 mg | ORAL_TABLET | Freq: Every day | ORAL | 1 refills | Status: DC

## 2020-03-02 NOTE — Progress Notes (Signed)
Patient ID: Michael Chaney, male   DOB: 1955/12/26, 64 y.o.   MRN: [id number]  No chief complaint on file.   HPI Michael Chaney is a 64 y.o. male who presents to establish care and evaluation of his chronic conditions. He has a history of hypertension and was out of 5 mg Amlodipine since September of 2021. He denies chest pain, palpitation, light headedness and vision changes. He smokes 3/4 pack of cigarette daily and admits the desire to quit. He requests influenza vaccine, reports that he's doing well and offers no further complaint.  Past Medical History:  Diagnosis Date  . Hypertension     No past surgical history on file.  No family history on file.  Social History Social History   Tobacco Use  . Smoking status: Former Smoker    Types: Cigarettes  . Smokeless tobacco: Never Used  Substance Use Topics  . Alcohol use: Not Currently  . Drug use: Not Currently    Allergies  Allergen Reactions  . Bee Venom Swelling    Current Outpatient Medications  Medication Sig Dispense Refill  . albuterol (VENTOLIN HFA) 108 (90 Base) MCG/ACT inhaler Inhale 2 puffs into the lungs every 6 (six) hours as needed for wheezing or shortness of breath. 18 g 2  . amLODipine (NORVASC) 5 MG tablet Take 1 tablet (5 mg total) by mouth daily. 30 tablet 1   No current facility-administered medications for this visit.    Review of Systems Review of Systems  Constitutional: Negative.   HENT: Negative.   Eyes: Negative.   Respiratory: Negative.   Cardiovascular: Negative.   Gastrointestinal: Negative.   Endocrine: Negative.   Genitourinary: Negative.   Musculoskeletal: Negative.   Skin: Negative.   Neurological: Negative.   Hematological: Negative.   Psychiatric/Behavioral: Negative.     Blood pressure (!) 154/89, pulse 87, resp. rate 16, weight 180 lb 8 oz (81.9 kg), SpO2 98 %.  Physical Exam Physical Exam HENT:     Head: Normocephalic and atraumatic.     Nose:     Comments:  Deferred per covid protocol    Mouth/Throat:     Comments: Deferred per covid protocol Eyes:     Extraocular Movements: Extraocular movements intact.     Conjunctiva/sclera: Conjunctivae normal.     Pupils: Pupils are equal, round, and reactive to light.  Cardiovascular:     Rate and Rhythm: Normal rate and regular rhythm.     Pulses: Normal pulses.     Heart sounds: Normal heart sounds.  Pulmonary:     Effort: Pulmonary effort is normal.     Breath sounds: Normal breath sounds.  Abdominal:     General: Abdomen is flat. Bowel sounds are normal.     Palpations: Abdomen is soft.  Genitourinary:    Comments: Deferred per patient Musculoskeletal:        General: Normal range of motion.     Cervical back: Normal range of motion.  Skin:    General: Skin is warm and dry.  Neurological:     General: No focal deficit present.     Mental Status: He is alert and oriented to person, place, and time. Mental status is at baseline.  Psychiatric:        Mood and Affect: Mood normal.        Behavior: Behavior normal.        Thought Content: Thought content normal.        Judgment: Judgment normal.     Data Reviewed  Lab and past medical history was reviewed.  Assessment and Plan  1. Essential hypertension - His blood pressure is not under control, his goal should be less than 150/90. He will continue on current treatment regimen, advised on smoking cessation, DASH diet and exercise as tolerated. - amLODipine (NORVASC) 5 MG tablet; Take 1 tablet (5 mg total) by mouth daily.  Dispense: 30 tablet; Refill: 1 - Hemoglobin A1c - Comp Met (CMET) - CBC w/Diff  2. Smoking - He was encouraged on smoking cessation and provided with [place] Quit line information - Ambulatory referral to Hematology / Oncology - Hemoglobin A1c - Comp Met (CMET) - CBC w/Diff  3. Encounter to establish care - Routine lab will be checked. - Flu Vaccine QUAD 6+ mos PF IM (Fluarix Quad PF) was administered. - Lipid  panel; Future - Urinalysis; Future - Urinalysis - Lipid panel - Hemoglobin A1c - Comp Met (CMET) - CBC w/Diff   Follow up: 04/13/2020 if symptoms worsen or fail to improve.  Berdene Askari E Kendrick Haapala 03/02/2020, 2:56 PM

## 2020-03-02 NOTE — Patient Instructions (Signed)
DASH Eating Plan DASH stands for "Dietary Approaches to Stop Hypertension." The DASH eating plan is a healthy eating plan that has been shown to reduce high blood pressure (hypertension). It may also reduce your risk for type 2 diabetes, heart disease, and stroke. The DASH eating plan may also help with weight loss. What are tips for following this plan?  General guidelines  Avoid eating more than 2,300 mg (milligrams) of salt (sodium) a day. If you have hypertension, you may need to reduce your sodium intake to 1,500 mg a day.  Limit alcohol intake to no more than 1 drink a day for nonpregnant women and 2 drinks a day for men. One drink equals 12 oz of beer, 5 oz of wine, or 1 oz of hard liquor.  Work with your health care provider to maintain a healthy body weight or to lose weight. Ask what an ideal weight is for you.  Get at least 30 minutes of exercise that causes your heart to beat faster (aerobic exercise) most days of the week. Activities may include walking, swimming, or biking.  Work with your health care provider or diet and nutrition specialist (dietitian) to adjust your eating plan to your individual calorie needs. Reading food labels   Check food labels for the amount of sodium per serving. Choose foods with less than 5 percent of the Daily Value of sodium. Generally, foods with less than 300 mg of sodium per serving fit into this eating plan.  To find whole grains, look for the word "whole" as the first word in the ingredient list. Shopping  Buy products labeled as "low-sodium" or "no salt added."  Buy fresh foods. Avoid canned foods and premade or frozen meals. Cooking  Avoid adding salt when cooking. Use salt-free seasonings or herbs instead of table salt or sea salt. Check with your health care provider or pharmacist before using salt substitutes.  Do not fry foods. Cook foods using healthy methods such as baking, boiling, grilling, and broiling instead.  Cook with  heart-healthy oils, such as olive, canola, soybean, or sunflower oil. Meal planning  Eat a balanced diet that includes: ? 5 or more servings of fruits and vegetables each day. At each meal, try to fill half of your plate with fruits and vegetables. ? Up to 6-8 servings of whole grains each day. ? Less than 6 oz of lean meat, poultry, or fish each day. A 3-oz serving of meat is about the same size as a deck of cards. One egg equals 1 oz. ? 2 servings of low-fat dairy each day. ? A serving of nuts, seeds, or beans 5 times each week. ? Heart-healthy fats. Healthy fats called Omega-3 fatty acids are found in foods such as flaxseeds and coldwater fish, like sardines, salmon, and mackerel.  Limit how much you eat of the following: ? Canned or prepackaged foods. ? Food that is high in trans fat, such as fried foods. ? Food that is high in saturated fat, such as fatty meat. ? Sweets, desserts, sugary drinks, and other foods with added sugar. ? Full-fat dairy products.  Do not salt foods before eating.  Try to eat at least 2 vegetarian meals each week.  Eat more home-cooked food and less restaurant, buffet, and fast food.  When eating at a restaurant, ask that your food be prepared with less salt or no salt, if possible. What foods are recommended? The items listed may not be a complete list. Talk with your dietitian about   what dietary choices are best for you. Grains Whole-grain or whole-wheat bread. Whole-grain or whole-wheat pasta. Brown rice. Oatmeal. Quinoa. Bulgur. Whole-grain and low-sodium cereals. Pita bread. Low-fat, low-sodium crackers. Whole-wheat flour tortillas. Vegetables Fresh or frozen vegetables (raw, steamed, roasted, or grilled). Low-sodium or reduced-sodium tomato and vegetable juice. Low-sodium or reduced-sodium tomato sauce and tomato paste. Low-sodium or reduced-sodium canned vegetables. Fruits All fresh, dried, or frozen fruit. Canned fruit in natural juice (without  added sugar). Meat and other protein foods Skinless chicken or turkey. Ground chicken or turkey. Pork with fat trimmed off. Fish and seafood. Egg whites. Dried beans, peas, or lentils. Unsalted nuts, nut butters, and seeds. Unsalted canned beans. Lean cuts of beef with fat trimmed off. Low-sodium, lean deli meat. Dairy Low-fat (1%) or fat-free (skim) milk. Fat-free, low-fat, or reduced-fat cheeses. Nonfat, low-sodium ricotta or cottage cheese. Low-fat or nonfat yogurt. Low-fat, low-sodium cheese. Fats and oils Soft margarine without trans fats. Vegetable oil. Low-fat, reduced-fat, or light mayonnaise and salad dressings (reduced-sodium). Canola, safflower, olive, soybean, and sunflower oils. Avocado. Seasoning and other foods Herbs. Spices. Seasoning mixes without salt. Unsalted popcorn and pretzels. Fat-free sweets. What foods are not recommended? The items listed may not be a complete list. Talk with your dietitian about what dietary choices are best for you. Grains Baked goods made with fat, such as croissants, muffins, or some breads. Dry pasta or rice meal packs. Vegetables Creamed or fried vegetables. Vegetables in a cheese sauce. Regular canned vegetables (not low-sodium or reduced-sodium). Regular canned tomato sauce and paste (not low-sodium or reduced-sodium). Regular tomato and vegetable juice (not low-sodium or reduced-sodium). Pickles. Olives. Fruits Canned fruit in a light or heavy syrup. Fried fruit. Fruit in cream or butter sauce. Meat and other protein foods Fatty cuts of meat. Ribs. Fried meat. Bacon. Sausage. Bologna and other processed lunch meats. Salami. Fatback. Hotdogs. Bratwurst. Salted nuts and seeds. Canned beans with added salt. Canned or smoked fish. Whole eggs or egg yolks. Chicken or turkey with skin. Dairy Whole or 2% milk, cream, and half-and-half. Whole or full-fat cream cheese. Whole-fat or sweetened yogurt. Full-fat cheese. Nondairy creamers. Whipped toppings.  Processed cheese and cheese spreads. Fats and oils Butter. Stick margarine. Lard. Shortening. Ghee. Bacon fat. Tropical oils, such as coconut, palm kernel, or palm oil. Seasoning and other foods Salted popcorn and pretzels. Onion salt, garlic salt, seasoned salt, table salt, and sea salt. Worcestershire sauce. Tartar sauce. Barbecue sauce. Teriyaki sauce. Soy sauce, including reduced-sodium. Steak sauce. Canned and packaged gravies. Fish sauce. Oyster sauce. Cocktail sauce. Horseradish that you find on the shelf. Ketchup. Mustard. Meat flavorings and tenderizers. Bouillon cubes. Hot sauce and Tabasco sauce. Premade or packaged marinades. Premade or packaged taco seasonings. Relishes. Regular salad dressings. Where to find more information:  National Heart, Lung, and Blood Institute: www.nhlbi.nih.gov  American Heart Association: www.heart.org Summary  The DASH eating plan is a healthy eating plan that has been shown to reduce high blood pressure (hypertension). It may also reduce your risk for type 2 diabetes, heart disease, and stroke.  With the DASH eating plan, you should limit salt (sodium) intake to 2,300 mg a day. If you have hypertension, you may need to reduce your sodium intake to 1,500 mg a day.  When on the DASH eating plan, aim to eat more fresh fruits and vegetables, whole grains, lean proteins, low-fat dairy, and heart-healthy fats.  Work with your health care provider or diet and nutrition specialist (dietitian) to adjust your eating plan to your   individual calorie needs. This information is not intended to replace advice given to you by your health care provider. Make sure you discuss any questions you have with your health care provider. Document Revised: 02/22/2017 Document Reviewed: 03/05/2016 Elsevier Patient Education  2020 Elsevier Inc.  

## 2020-03-03 LAB — URINALYSIS
Bilirubin, UA: NEGATIVE
Glucose, UA: NEGATIVE
Leukocytes,UA: NEGATIVE
Nitrite, UA: NEGATIVE
Protein,UA: NEGATIVE
Specific Gravity, UA: 1.024 (ref 1.005–1.030)
Urobilinogen, Ur: 0.2 mg/dL (ref 0.2–1.0)
pH, UA: 5.5 (ref 5.0–7.5)

## 2020-03-03 LAB — COMPREHENSIVE METABOLIC PANEL
ALT: 13 IU/L (ref 0–44)
AST: 17 IU/L (ref 0–40)
Albumin/Globulin Ratio: 1.7 (ref 1.2–2.2)
Albumin: 4.8 g/dL (ref 3.8–4.8)
Alkaline Phosphatase: 75 IU/L (ref 44–121)
BUN/Creatinine Ratio: 19 (ref 10–24)
BUN: 17 mg/dL (ref 8–27)
Bilirubin Total: 0.7 mg/dL (ref 0.0–1.2)
CO2: 23 mmol/L (ref 20–29)
Calcium: 10.1 mg/dL (ref 8.6–10.2)
Chloride: 100 mmol/L (ref 96–106)
Creatinine, Ser: 0.91 mg/dL (ref 0.76–1.27)
GFR calc Af Amer: 102 mL/min/{1.73_m2} (ref >59)
GFR calc non Af Amer: 88 mL/min/{1.73_m2} (ref >59)
Globulin, Total: 2.9 g/dL (ref 1.5–4.5)
Glucose: 89 mg/dL (ref 65–99)
Potassium: 4 mmol/L (ref 3.5–5.2)
Sodium: 140 mmol/L (ref 134–144)
Total Protein: 7.7 g/dL (ref 6.0–8.5)

## 2020-03-03 LAB — CBC WITH DIFFERENTIAL/PLATELET
Basophils Absolute: 0.1 10*3/uL (ref 0.0–0.2)
Basos: 1 %
EOS (ABSOLUTE): 0.1 10*3/uL (ref 0.0–0.4)
Eos: 1 %
Hematocrit: 49.4 % (ref 37.5–51.0)
Hemoglobin: 17.3 g/dL (ref 13.0–17.7)
Immature Grans (Abs): 0.1 10*3/uL (ref 0.0–0.1)
Immature Granulocytes: 1 %
Lymphocytes Absolute: 1.7 10*3/uL (ref 0.7–3.1)
Lymphs: 17 %
MCH: 32.1 pg (ref 26.6–33.0)
MCHC: 35 g/dL (ref 31.5–35.7)
MCV: 92 fL (ref 79–97)
Monocytes Absolute: 0.8 10*3/uL (ref 0.1–0.9)
Monocytes: 8 %
Neutrophils Absolute: 7.3 10*3/uL — ABNORMAL HIGH (ref 1.4–7.0)
Neutrophils: 72 %
Platelets: 295 10*3/uL (ref 150–450)
RBC: 5.39 x10E6/uL (ref 4.14–5.80)
RDW: 12.4 % (ref 11.6–15.4)
WBC: 10 10*3/uL (ref 3.4–10.8)

## 2020-03-03 LAB — HEMOGLOBIN A1C
Est. average glucose Bld gHb Est-mCnc: 128 mg/dL
Hgb A1c MFr Bld: 6.1 % — ABNORMAL HIGH (ref 4.8–5.6)

## 2020-03-03 LAB — LIPID PANEL
Chol/HDL Ratio: 3.9 ratio (ref 0.0–5.0)
Cholesterol, Total: 248 mg/dL — ABNORMAL HIGH (ref 100–199)
HDL: 64 mg/dL (ref >39)
LDL Chol Calc (NIH): 170 mg/dL — ABNORMAL HIGH (ref 0–99)
Triglycerides: 82 mg/dL (ref 0–149)
VLDL Cholesterol Cal: 14 mg/dL (ref 5–40)

## 2020-03-08 ENCOUNTER — Other Ambulatory Visit: Payer: Self-pay

## 2020-03-08 ENCOUNTER — Ambulatory Visit: Payer: Self-pay | Admitting: Pharmacy Technician

## 2020-03-08 DIAGNOSIS — Z79899 Other long term (current) drug therapy: Secondary | ICD-10-CM

## 2020-03-08 NOTE — Progress Notes (Signed)
Met with patient completed financial assistance application for [facility] due to recent hospital visit.  Patient agreed to be responsible for gathering financial information and forwarding to appropriate department in [facility].    Completed [facility] application and contract.  Patient agreed to all terms of the [facility] contract.  Mailing contract to patient to sign and return to [facility].    Spoke with Mr. Benjie Karvonen at [employer].  Mr. Benjie Karvonen indicated that Mr. 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    Patient approved to receive medication assistance at [facility] until time for re-certification in 2595 and as long as eligibility criteria continues to be met.    Provided patient with Civil engineer, contracting based on his particular needs.    [facility]

## 2020-03-09 ENCOUNTER — Telehealth: Payer: Self-pay | Admitting: *Deleted

## 2020-03-09 DIAGNOSIS — Z122 Encounter for screening for malignant neoplasm of respiratory organs: Secondary | ICD-10-CM

## 2020-03-09 DIAGNOSIS — Z87891 Personal history of nicotine dependence: Secondary | ICD-10-CM

## 2020-03-09 NOTE — Telephone Encounter (Signed)
Received referral for initial lung cancer screening scan. Contacted patient and obtained smoking history,(current, 47 pack year) as well as answering questions related to screening process. Patient denies signs of lung cancer such as weight loss or hemoptysis. Patient denies comorbidity that would prevent curative treatment if lung cancer were found. Patient is scheduled for shared decision making visit and CT scan on 03/29/20 at 145pm.

## 2020-03-29 ENCOUNTER — Encounter: Payer: Self-pay | Admitting: Nurse Practitioner

## 2020-03-29 ENCOUNTER — Ambulatory Visit
Admission: RE | Admit: 2020-03-29 | Discharge: 2020-03-29 | Disposition: A | Discharge status: Home / Self Care | Payer: Self-pay | Source: Ambulatory Visit | Attending: Nurse Practitioner | Admitting: Nurse Practitioner

## 2020-03-29 ENCOUNTER — Other Ambulatory Visit: Payer: Self-pay

## 2020-03-29 ENCOUNTER — Inpatient Hospital Stay: Payer: Self-pay | Attending: Nurse Practitioner | Admitting: Hospice and Palliative Medicine

## 2020-03-29 DIAGNOSIS — Z87891 Personal history of nicotine dependence: Secondary | ICD-10-CM

## 2020-03-29 DIAGNOSIS — Z122 Encounter for screening for malignant neoplasm of respiratory organs: Secondary | ICD-10-CM

## 2020-03-29 NOTE — Progress Notes (Signed)
Virtual Visit via Video Note  I connected with@ on 03/29/20 at@ by a video enabled telemedicine application and verified that I am speaking with the correct person using two identifiers.   I discussed the limitations of evaluation and management by telemedicine and the availability of in person appointments. The patient expressed understanding and agreed to proceed.  In accordance with CMS guidelines, patient has met eligibility criteria including age, absence of signs or symptoms of lung cancer.  Social History   Tobacco Use  . Smoking status: Former Smoker    Packs/day: 1.00    Years: 47.00    Pack years: 47.00    Types: Cigarettes  . Smokeless tobacco: Never Used  Substance Use Topics  . Alcohol use: Not Currently  . Drug use: Not Currently      A shared decision-making session was conducted prior to the performance of CT scan. This includes one or more decision aids, includes benefits and harms of screening, follow-up diagnostic testing, over-diagnosis, false positive rate, and total radiation exposure.   Counseling on the importance of adherence to annual lung cancer LDCT screening, impact of co-morbidities, and ability or willingness to undergo diagnosis and treatment is imperative for compliance of the program.   Counseling on the importance of continued smoking cessation for former smokers; the importance of smoking cessation for current smokers, and information about tobacco cessation interventions have been given to patient including [place] and [facility] quit [place] programs.   Written order for lung cancer screening with LDCT has been given to the patient and any and all questions have been answered to the best of my abilities.    Yearly follow up will be coordinated by Burgess Estelle, Thoracic Navigator.  Time Total: 15 minutes  Visit consisted of counseling and education dealing with complex health screening. Greater than 50%  of this time was spent counseling and  coordinating care related to the above assessment and plan.  Signed by: Altha Harm, PhD, NP-C

## 2020-03-31 ENCOUNTER — Telehealth: Payer: Self-pay | Admitting: *Deleted

## 2020-03-31 NOTE — Telephone Encounter (Signed)
Notified patient of LDCT lung cancer screening program results with recommendation for 6 month follow up imaging. Also notified of incidental findings noted below and is encouraged to discuss further with PCP who will receive a copy of this note and/or the CT report. Patient verbalizes understanding.   IMPRESSION: 1. Lung-RADS 3, probably benign findings. Short-term follow-up in 6 months is recommended with repeat low-dose chest CT without contrast (please use the following order, "CT CHEST LCS NODULE FOLLOW-UP W/O CM"). 2.  Emphysema (ICD10-J43.9) and Aortic Atherosclerosis (ICD10-170.0) 

## 2020-04-13 ENCOUNTER — Telehealth: Payer: Self-pay | Admitting: Gerontology

## 2020-04-13 ENCOUNTER — Other Ambulatory Visit: Payer: Self-pay | Admitting: Gerontology

## 2020-04-13 ENCOUNTER — Other Ambulatory Visit: Payer: Self-pay

## 2020-04-13 DIAGNOSIS — IMO0001 Reserved for inherently not codable concepts without codable children: Secondary | ICD-10-CM

## 2020-04-13 DIAGNOSIS — N289 Disorder of kidney and ureter, unspecified: Secondary | ICD-10-CM

## 2020-04-13 DIAGNOSIS — E785 Hyperlipidemia, unspecified: Secondary | ICD-10-CM

## 2020-04-13 DIAGNOSIS — F17200 Nicotine dependence, unspecified, uncomplicated: Secondary | ICD-10-CM

## 2020-04-13 DIAGNOSIS — R7303 Prediabetes: Secondary | ICD-10-CM

## 2020-04-13 DIAGNOSIS — I10 Essential (primary) hypertension: Secondary | ICD-10-CM

## 2020-04-13 MED ORDER — ALBUTEROL SULFATE HFA 108 (90 BASE) MCG/ACT IN AERS: 2.0000 | INHALATION_SPRAY | Freq: Four times a day (QID) | RESPIRATORY_TRACT | 2 refills | Status: DC | PRN

## 2020-04-13 MED ORDER — ROSUVASTATIN CALCIUM 5 MG PO TABS: 5.0000 mg | ORAL_TABLET | Freq: Every day | ORAL | 0 refills | Status: DC

## 2020-04-13 MED ORDER — AMLODIPINE BESYLATE 5 MG PO TABS: 5.0000 mg | ORAL_TABLET | Freq: Every day | ORAL | 1 refills | Status: DC

## 2020-04-13 NOTE — Patient Instructions (Addendum)
https://www.nhlbi.nih.gov/files/docs/public/heart/dash_brief.pdf">  DASH Eating Plan DASH stands for Dietary Approaches to Stop Hypertension. The DASH eating plan is a healthy eating plan that has been shown to:  Reduce high blood pressure (hypertension).  Reduce your risk for type 2 diabetes, heart disease, and stroke.  Help with weight loss. What are tips for following this plan? Reading food labels  Check food labels for the amount of salt (sodium) per serving. Choose foods with less than 5 percent of the Daily Value of sodium. Generally, foods with less than 300 milligrams (mg) of sodium per serving fit into this eating plan.  To find whole grains, look for the word "whole" as the first word in the ingredient list. Shopping  Buy products labeled as "low-sodium" or "no salt added."  Buy fresh foods. Avoid canned foods and pre-made or frozen meals. Cooking  Avoid adding salt when cooking. Use salt-free seasonings or herbs instead of table salt or sea salt. Check with your health care provider or pharmacist before using salt substitutes.  Do not fry foods. Cook foods using healthy methods such as baking, boiling, grilling, roasting, and broiling instead.  Cook with heart-healthy oils, such as olive, canola, avocado, soybean, or sunflower oil. Meal planning  Eat a balanced diet that includes: ? 4 or more servings of fruits and 4 or more servings of vegetables each day. Try to fill one-half of your plate with fruits and vegetables. ? 6-8 servings of whole grains each day. ? Less than 6 oz (170 g) of lean meat, poultry, or fish each day. A 3-oz (85-g) serving of meat is about the same size as a deck of cards. One egg equals 1 oz (28 g). ? 2-3 servings of low-fat dairy each day. One serving is 1 cup (237 mL). ? 1 serving of nuts, seeds, or beans 5 times each week. ? 2-3 servings of heart-healthy fats. Healthy fats called omega-3 fatty acids are found in foods such as walnuts,  flaxseeds, fortified milks, and eggs. These fats are also found in cold-water fish, such as sardines, salmon, and mackerel.  Limit how much you eat of: ? Canned or prepackaged foods. ? Food that is high in trans fat, such as some fried foods. ? Food that is high in saturated fat, such as fatty meat. ? Desserts and other sweets, sugary drinks, and other foods with added sugar. ? Full-fat dairy products.  Do not salt foods before eating.  Do not eat more than 4 egg yolks a week.  Try to eat at least 2 vegetarian meals a week.  Eat more home-cooked food and less restaurant, buffet, and fast food.   Lifestyle  When eating at a restaurant, ask that your food be prepared with less salt or no salt, if possible.  If you drink alcohol: ? Limit how much you use to:  0-1 drink a day for women who are not pregnant.  0-2 drinks a day for men. ? Be aware of how much alcohol is in your drink. In the [place] one drink equals one 12 oz bottle of beer (355 mL), one 5 oz glass of wine (148 mL), or one 1 oz glass of hard liquor (44 mL). General information  Avoid eating more than 2,300 mg of salt a day. If you have hypertension, you may need to reduce your sodium intake to 1,500 mg a day.  Work with your health care provider to maintain a healthy body weight or to lose weight. Ask what an ideal weight is for   you.  Get at least 30 minutes of exercise that causes your heart to beat faster (aerobic exercise) most days of the week. Activities may include walking, swimming, or biking.  Work with your health care provider or dietitian to adjust your eating plan to your individual calorie needs. What foods should I eat? Fruits All fresh, dried, or frozen fruit. Canned fruit in natural juice (without added sugar). Vegetables Fresh or frozen vegetables (raw, steamed, roasted, or grilled). Low-sodium or reduced-sodium tomato and vegetable juice. Low-sodium or reduced-sodium tomato sauce and tomato paste.  Low-sodium or reduced-sodium canned vegetables. Grains Whole-grain or whole-wheat bread. Whole-grain or whole-wheat pasta. Brown rice. Oatmeal. Quinoa. Bulgur. Whole-grain and low-sodium cereals. Pita bread. Low-fat, low-sodium crackers. Whole-wheat flour tortillas. Meats and other proteins Skinless chicken or turkey. Ground chicken or turkey. Pork with fat trimmed off. Fish and seafood. Egg whites. Dried beans, peas, or lentils. Unsalted nuts, nut butters, and seeds. Unsalted canned beans. Lean cuts of beef with fat trimmed off. Low-sodium, lean precooked or cured meat, such as sausages or meat loaves. Dairy Low-fat (1%) or fat-free (skim) milk. Reduced-fat, low-fat, or fat-free cheeses. Nonfat, low-sodium ricotta or cottage cheese. Low-fat or nonfat yogurt. Low-fat, low-sodium cheese. Fats and oils Soft margarine without trans fats. Vegetable oil. Reduced-fat, low-fat, or light mayonnaise and salad dressings (reduced-sodium). Canola, safflower, olive, avocado, soybean, and sunflower oils. Avocado. Seasonings and condiments Herbs. Spices. Seasoning mixes without salt. Other foods Unsalted popcorn and pretzels. Fat-free sweets. The items listed above may not be a complete list of foods and beverages you can eat. Contact a dietitian for more information. What foods should I avoid? Fruits Canned fruit in a light or heavy syrup. Fried fruit. Fruit in cream or butter sauce. Vegetables Creamed or fried vegetables. Vegetables in a cheese sauce. Regular canned vegetables (not low-sodium or reduced-sodium). Regular canned tomato sauce and paste (not low-sodium or reduced-sodium). Regular tomato and vegetable juice (not low-sodium or reduced-sodium). Pickles. Olives. Grains Baked goods made with fat, such as croissants, muffins, or some breads. Dry pasta or rice meal packs. Meats and other proteins Fatty cuts of meat. Ribs. Fried meat. Bacon. Bologna, salami, and other precooked or cured meats, such as  sausages or meat loaves. Fat from the back of a pig (fatback). Bratwurst. Salted nuts and seeds. Canned beans with added salt. Canned or smoked fish. Whole eggs or egg yolks. Chicken or turkey with skin. Dairy Whole or 2% milk, cream, and half-and-half. Whole or full-fat cream cheese. Whole-fat or sweetened yogurt. Full-fat cheese. Nondairy creamers. Whipped toppings. Processed cheese and cheese spreads. Fats and oils Butter. Stick margarine. Lard. Shortening. Ghee. Bacon fat. Tropical oils, such as coconut, palm kernel, or palm oil. Seasonings and condiments Onion salt, garlic salt, seasoned salt, table salt, and sea salt. Worcestershire sauce. Tartar sauce. Barbecue sauce. Teriyaki sauce. Soy sauce, including reduced-sodium. Steak sauce. Canned and packaged gravies. Fish sauce. Oyster sauce. Cocktail sauce. Store-bought horseradish. Ketchup. Mustard. Meat flavorings and tenderizers. Bouillon cubes. Hot sauces. Pre-made or packaged marinades. Pre-made or packaged taco seasonings. Relishes. Regular salad dressings. Other foods Salted popcorn and pretzels. The items listed above may not be a complete list of foods and beverages you should avoid. Contact a dietitian for more information. Where to find more information  National Heart, Lung, and Blood Institute: www.nhlbi.nih.gov  American Heart Association: www.heart.org  Academy of Nutrition and Dietetics: www.eatright.org  National Kidney Foundation: www.kidney.org Summary  The DASH eating plan is a healthy eating plan that has been shown to   reduce high blood pressure (hypertension). It may also reduce your risk for type 2 diabetes, heart disease, and stroke.  When on the DASH eating plan, aim to eat more fresh fruits and vegetables, whole grains, lean proteins, low-fat dairy, and heart-healthy fats.  With the DASH eating plan, you should limit salt (sodium) intake to 2,300 mg a day. If you have hypertension, you may need to reduce your  sodium intake to 1,500 mg a day.  Work with your health care provider or dietitian to adjust your eating plan to your individual calorie needs. This information is not intended to replace advice given to you by your health care provider. Make sure you discuss any questions you have with your health care provider. Document Revised: 02/13/2019 Document Reviewed: 02/13/2019 Elsevier Patient Education  2021 [employer] Inc.  Prediabetes Eating Plan Prediabetes is a condition that causes blood sugar (glucose) levels to be higher than normal. This increases the risk for developing type 2 diabetes (type 2 diabetes mellitus). Working with a health care provider or nutrition specialist (dietitian) to make diet and lifestyle changes can help prevent the onset of diabetes. These changes may help you:  Control your blood glucose levels.  Improve your cholesterol levels.  Manage your blood pressure. What are tips for following this plan? Reading food labels  Read food labels to check the amount of fat, salt (sodium), and sugar in prepackaged foods. Avoid foods that have: ? Saturated fats. ? Trans fats. ? Added sugars.  Avoid foods that have more than 300 milligrams (mg) of sodium per serving. Limit your sodium intake to less than 2,300 mg each day. Shopping  Avoid buying pre-made and processed foods.  Avoid buying drinks with added sugar. Cooking  Cook with olive oil. Do not use butter, lard, or ghee.  Bake, broil, grill, steam, or boil foods. Avoid frying. Meal planning  Work with your dietitian to create an eating plan that is right for you. This may include tracking how many calories you take in each day. Use a food diary, notebook, or mobile application to track what you eat at each meal.  Consider following a Mediterranean diet. This includes: ? Eating several servings of fresh fruits and vegetables each day. ? Eating fish at least twice a week. ? Eating one serving each day of whole  grains, beans, nuts, and seeds. ? Using olive oil instead of other fats. ? Limiting alcohol. ? Limiting red meat. ? Using nonfat or low-fat dairy products.  Consider following a plant-based diet. This includes dietary choices that focus on eating mostly vegetables and fruit, grains, beans, nuts, and seeds.  If you have high blood pressure, you may need to limit your sodium intake or follow a diet such as the DASH (Dietary Approaches to Stop Hypertension) eating plan. The DASH diet aims to lower high blood pressure.   Lifestyle  Set weight loss goals with help from your health care team. It is recommended that most people with prediabetes lose 7% of their body weight.  Exercise for at least 30 minutes 5 or more days a week.  Attend a support group or seek support from a mental health counselor.  Take over-the-counter and prescription medicines only as told by your health care provider. What foods are recommended? Fruits Berries. Bananas. Apples. Oranges. Grapes. Papaya. Mango. Pomegranate. Kiwi. Grapefruit. Cherries. Vegetables Lettuce. Spinach. Peas. Beets. Cauliflower. Cabbage. Broccoli. Carrots. Tomatoes. Squash. Eggplant. Herbs. Peppers. Onions. Cucumbers. Brussels sprouts. Grains Whole grains, such as whole-wheat or whole-grain breads,   crackers, cereals, and pasta. Unsweetened oatmeal. Bulgur. Barley. Quinoa. Brown rice. Corn or whole-wheat flour tortillas or taco shells. Meats and other proteins Seafood. Poultry without skin. Lean cuts of pork and beef. Tofu. Eggs. Nuts. Beans. Dairy Low-fat or fat-free dairy products, such as yogurt, cottage cheese, and cheese. Beverages Water. Tea. Coffee. Sugar-free or diet soda. Seltzer water. Low-fat or nonfat milk. Milk alternatives, such as soy or almond milk. Fats and oils Olive oil. Canola oil. Sunflower oil. Grapeseed oil. Avocado. Walnuts. Sweets and desserts Sugar-free or low-fat pudding. Sugar-free or low-fat ice cream and other  frozen treats. Seasonings and condiments Herbs. Sodium-free spices. Mustard. Relish. Low-salt, low-sugar ketchup. Low-salt, low-sugar barbecue sauce. Low-fat or fat-free mayonnaise. The items listed above may not be a complete list of recommended foods and beverages. Contact a dietitian for more information. What foods are not recommended? Fruits Fruits canned with syrup. Vegetables Canned vegetables. Frozen vegetables with butter or cream sauce. Grains Refined white flour and flour products, such as bread, pasta, snack foods, and cereals. Meats and other proteins Fatty cuts of meat. Poultry with skin. Breaded or fried meat. Processed meats. Dairy Full-fat yogurt, cheese, or milk. Beverages Sweetened drinks, such as iced tea and soda. Fats and oils Butter. Lard. Ghee. Sweets and desserts Baked goods, such as cake, cupcakes, pastries, cookies, and cheesecake. Seasonings and condiments Spice mixes with added salt. Ketchup. Barbecue sauce. Mayonnaise. The items listed above may not be a complete list of foods and beverages that are not recommended. Contact a dietitian for more information. Where to find more information  American Diabetes Association: www.diabetes.org Summary  You may need to make diet and lifestyle changes to help prevent the onset of diabetes. These changes can help you control blood sugar, improve cholesterol levels, and manage blood pressure.  Set weight loss goals with help from your health care team. It is recommended that most people with prediabetes lose 7% of their body weight.  Consider following a Mediterranean diet. This includes eating plenty of fresh fruits and vegetables, whole grains, beans, nuts, seeds, fish, and low-fat dairy, and using olive oil instead of other fats. This information is not intended to replace advice given to you by your health care provider. Make sure you discuss any questions you have with your health care provider. Document  Revised: 06/11/2019 Document Reviewed: 06/11/2019 Elsevier Patient Education  2021 [employer] Inc.  Preventing High Cholesterol Cholesterol is a white, waxy substance similar to fat that the human body needs to help build cells. The liver makes all the cholesterol that a person's body needs. Having high cholesterol (hypercholesterolemia) increases your risk for heart disease and stroke. Extra or excess cholesterol comes from the food that you eat. High cholesterol can often be prevented with diet and lifestyle changes. If you already have high cholesterol, you can control it with diet, lifestyle changes, and medicines. How can high cholesterol affect me? If you have high cholesterol, fatty deposits (plaques) may build up on the walls of your blood vessels. The blood vessels that carry blood away from your heart are called arteries. Plaques make the arteries narrower and stiffer. This in turn can:  Restrict or block blood flow and cause blood clots to form.  Increase your risk for heart attack and stroke. What can increase my risk for high cholesterol? This condition is more likely to develop in people who:  Eat foods that are high in saturated fat or cholesterol. Saturated fat is mostly found in foods that come from animal  sources.  Are overweight.  Are not getting enough exercise.  Have a family history of high cholesterol (familial hypercholesterolemia). What actions can I take to prevent this? Nutrition  Eat less saturated fat.  Avoid trans fats (partially hydrogenated oils). These are often found in margarine and in some baked goods, fried foods, and snacks bought in packages.  Avoid precooked or cured meat, such as bacon, sausages, or meat loaves.  Avoid foods and drinks that have added sugars.  Eat more fruits, vegetables, and whole grains.  Choose healthy sources of protein, such as fish, poultry, lean cuts of red meat, beans, peas, lentils, and nuts.  Choose healthy  sources of fat, such as: ? Nuts. ? Vegetable oils, especially olive oil. ? Fish that have healthy fats, such as omega-3 fatty acids. These fish include mackerel or salmon.   Lifestyle  Lose weight if you are overweight. Maintaining a healthy body mass index (BMI) can help prevent or control high cholesterol. It can also lower your risk for diabetes and high blood pressure. Ask your health care provider to help you with a diet and exercise plan to lose weight safely.  Do not use any products that contain nicotine or tobacco, such as cigarettes, e-cigarettes, and chewing tobacco. If you need help quitting, ask your health care provider. Alcohol use  Do not drink alcohol if: ? Your health care provider tells you not to drink. ? You are pregnant, may be pregnant, or are planning to become pregnant.  If you drink alcohol: ? Limit how much you use to:  0-1 drink a day for women.  0-2 drinks a day for men. ? Be aware of how much alcohol is in your drink. In the [place] one drink equals one 12 oz bottle of beer (355 mL), one 5 oz glass of wine (148 mL), or one 1 oz glass of hard liquor (44 mL). Activity  Get enough exercise. Do exercises as told by your health care provider.  Each week, do at least 150 minutes of exercise that takes a medium level of effort (moderate-intensity exercise). This kind of exercise: ? Makes your heart beat faster while allowing you to still be able to talk. ? Can be done in short sessions several times a day or longer sessions a few times a week. For example, on 5 days each week, you could walk fast or ride your bike 3 times a day for 10 minutes each time.   Medicines  Your health care provider may recommend medicines to help lower cholesterol. This may be a medicine to lower the amount of cholesterol that your liver makes. You may need medicine if: ? Diet and lifestyle changes have not lowered your cholesterol enough. ? You have high cholesterol and other risk  factors for heart disease or stroke.  Take over-the-counter and prescription medicines only as told by your health care provider. General information  Manage your risk factors for high cholesterol. Talk with your health care provider about all your risk factors and how to lower your risk.  Manage other conditions that you have, such as diabetes or high blood pressure (hypertension).  Have blood tests to check your cholesterol levels at regular points in time as told by your health care provider.  Keep all follow-up visits as told by your health care provider. This is important. Where to find more information  American Heart Association: www.heart.org  National Heart, Lung, and Blood Institute: [employer].is Summary  High cholesterol increases your risk for heart  disease and stroke. By keeping your cholesterol level low, you can reduce your risk for these conditions.  High cholesterol can often be prevented with diet and lifestyle changes.  Work with your health care provider to manage your risk factors, and have your blood tested regularly. This information is not intended to replace advice given to you by your health care provider. Make sure you discuss any questions you have with your health care provider. Document Revised: 12/23/2018 Document Reviewed: 12/23/2018 Elsevier Patient Education  2021 [employer].

## 2020-04-13 NOTE — Progress Notes (Signed)
Established Patient Office Visit  Subjective:  Patient ID: Michael Chaney, male    DOB: 12-05-1955  Age: 65 y.o. MRN: [id number]  CC: No chief complaint on file.  Patient consents to telephone visit and 2 patient identifiers was used to identify patient.  HPI Michael Chaney presents for follow up of hypertension and lab review. He states that he's compliant with his medication, denies any side effects and continues to work on his diet. He checked his blood pressure during visit and it was 126/79 and pulse 77. He continues to smoke 1 pack of cigarettes in 2 days and admits the desire to quit. He had Low dose CT scan done on 03/29/20 and it showed Centrilobular and paraseptal emphysema evident. Tiny bilateral pulmonary nodules are identified, some of which are calcified consistent with granulomata. Dominant noncalcified pulmonary nodule is 6.6 mm in the peripheral right upper lobe (image 69). No focal airspace consolidation. No pleural effusion and a CT Chest LCS Nodule follow up in 6 months was recommended. Also the CT scan showed 1.6 cm lesion to upper pole of his left kidney approaches water density, compatible with a cyst.  His LDL was 170 mg/dl, total cholesterol was 248 mg/dl. His HgbA1c done 03/02/20 was 6.1% and he states that he's been eating out. His urinalysis showed trace RBC and Ketones. He denies chest pain, palpitation, light headedness and overall, he states that he's doing well and offers no further complaint.  Past Medical History:  Diagnosis Date  . Hypertension     No past surgical history on file.  No family history on file.  Social History   Socioeconomic History  . Marital status: Single    Spouse name: Not on file  . Number of children: Not on file  . Years of education: Not on file  . Highest education level: Not on file  Occupational History  . Not on file  Tobacco Use  . Smoking status: Former Smoker    Packs/day: 1.00    Years: 47.00    Pack years:  47.00    Types: Cigarettes  . Smokeless tobacco: Never Used  Substance and Sexual Activity  . Alcohol use: Not Currently  . Drug use: Not Currently  . Sexual activity: Not on file  Other Topics Concern  . Not on file  Social History Narrative  . Not on file   Social Determinants of Health   Financial Resource Strain: Not on file  Food Insecurity: Not on file  Transportation Needs: Not on file  Physical Activity: Not on file  Stress: Not on file  Social Connections: Not on file  Intimate Partner Violence: Not on file    Outpatient Medications Prior to Visit  Medication Sig Dispense Refill  . albuterol (VENTOLIN HFA) 108 (90 Base) MCG/ACT inhaler Inhale 2 puffs into the lungs every 6 (six) hours as needed for wheezing or shortness of breath. 18 g 2  . amLODipine (NORVASC) 5 MG tablet Take 1 tablet (5 mg total) by mouth daily. 30 tablet 1   No facility-administered medications prior to visit.    Allergies  Allergen Reactions  . Bee Venom Swelling    ROS Review of Systems  Constitutional: Negative.   Respiratory: Negative.   Cardiovascular: Negative.   Genitourinary: Negative.   Neurological: Negative.   Psychiatric/Behavioral: Negative.       Objective:    Physical Exam No Physical exam was done There were no vitals taken for this visit. Wt Readings from Last 3 Encounters:  03/29/20 184 lb (83.5 kg)  03/02/20 180 lb 8 oz (81.9 kg)  11/12/19 192 lb (87.1 kg)     Health Maintenance Due  Topic Date Due  . Hepatitis C Screening  Never done  . COVID-19 Vaccine (1) Never done  . HIV Screening  Never done  . TETANUS/TDAP  Never done  . COLONOSCOPY (Pts 45-71yrs Insurance coverage will need to be confirmed)  Never done  . PNA vac Low Risk Adult (1 of 2 - PCV13) Never done    There are no preventive care reminders to display for this patient.  No results found for: TSH Lab Results  Component Value Date   WBC 10.0 03/02/2020   HGB 17.3 03/02/2020   HCT  49.4 03/02/2020   MCV 92 03/02/2020   PLT 295 03/02/2020   Lab Results  Component Value Date   NA 140 03/02/2020   K 4.0 03/02/2020   CO2 23 03/02/2020   GLUCOSE 89 03/02/2020   BUN 17 03/02/2020   CREATININE 0.91 03/02/2020   BILITOT 0.7 03/02/2020   ALKPHOS 75 03/02/2020   AST 17 03/02/2020   ALT 13 03/02/2020   PROT 7.7 03/02/2020   ALBUMIN 4.8 03/02/2020   CALCIUM 10.1 03/02/2020   Lab Results  Component Value Date   CHOL 248 (H) 03/02/2020   Lab Results  Component Value Date   HDL 64 03/02/2020   Lab Results  Component Value Date   LDLCALC 170 (H) 03/02/2020   Lab Results  Component Value Date   TRIG 82 03/02/2020   Lab Results  Component Value Date   CHOLHDL 3.9 03/02/2020   Lab Results  Component Value Date   HGBA1C 6.1 (H) 03/02/2020      Assessment & Plan:   1. Essential hypertension - His blood pressure is under control, he will continue on current treatment regimen and DASH diet. - amLODipine (NORVASC) 5 MG tablet; Take 1 tablet (5 mg total) by mouth daily.  Dispense: 30 tablet; Refill: 1 - Urinalysis; Future  2. Smoking - He was encouraged on smoking cessation, will mail [place] Quit line flyer to patient. - albuterol (VENTOLIN HFA) 108 (90 Base) MCG/ACT inhaler; Inhale 2 puffs into the lungs every 6 (six) hours as needed for wheezing or shortness of breath.  Dispense: 18 g; Refill: 2  3. Elevated lipids -His ASCVD 10 year risk was 25.3%, he was started on Crestor 5 mg daily, was educated on side effects and advised to notify clinic, continue on low fat/cholesterol diet. - rosuvastatin (CRESTOR) 5 MG tablet; Take 1 tablet (5 mg total) by mouth daily.  Dispense: 30 tablet; Refill: 0  4. Prediabetes - His HgbA1c was 6.1%, he was advised to continue on low carb/non concentrated sweet diet.  5. Kidney lesion, native, left -He will follow up with Nephrology for evaluation of 1.6 cm lesion on upper pole of left kidney. - Ambulatory referral to  Nephrology     Follow-up: No follow-ups on file.    Corinthian Mizrahi Trellis Paganini, NP

## 2020-04-14 ENCOUNTER — Telehealth: Payer: Self-pay | Admitting: Pharmacist

## 2020-04-14 NOTE — Telephone Encounter (Signed)
04/14/2020 8:29:29 AM - ProAir HFA forms to patient & Dr  -- Michael Chaney - Thursday, April 14, 2020 8:28 AM --Received pharmacy printout for ProAir HFA Inhale 2 puffs into the lungs every 6 hours as needed for wheezing or shortness of breath. Printed [employer] application-put patient portion in basket with meds, and sending provider portion to [facility].

## 2020-04-18 ENCOUNTER — Ambulatory Visit: Payer: Self-pay | Admitting: Pharmacist

## 2020-04-18 ENCOUNTER — Other Ambulatory Visit: Payer: Self-pay

## 2020-04-18 ENCOUNTER — Encounter: Payer: Self-pay | Admitting: Pharmacist

## 2020-04-18 DIAGNOSIS — Z79899 Other long term (current) drug therapy: Secondary | ICD-10-CM

## 2020-04-18 NOTE — Progress Notes (Signed)
Medication Management Clinic Visit Note  Patient: Michael Chaney MRN: [id number] Date of Birth: 1955/08/12 PCP: Patient, No Pcp Per   [name] 65 y.o. male presents for a telephone visit for medication management today. Verified patient with two identifiers.  There were no vitals taken for this visit.  Patient Information   Past Medical History:  Diagnosis Date  . Asthma   . Hyperlipidemia   . Hypertension      History reviewed. No pertinent surgical history.  History reviewed. No pertinent family history.  New Diagnoses (since last visit): HLD  Family Support: Good  Lifestyle Diet: Breakfast: skips usually because he isn't hungry Lunch: sandwich  Dinner: sandwich  Drinks: water    Current Exercise Habits: The patient has a physically strenuous job, but has no regular exercise apart from work.       Social History   Substance and Sexual Activity  Alcohol Use Not Currently      Social History   Tobacco Use  Smoking Status Former Smoker  . Packs/day: 0.25  . Years: 47.00  . Pack years: 11.75  . Types: Cigarettes  Smokeless Tobacco Never Used      Health Maintenance  Topic Date Due  . Hepatitis C Screening  Never done  . COVID-19 Vaccine (1) Never done  . HIV Screening  Never done  . TETANUS/TDAP  Never done  . COLONOSCOPY (Pts 45-8yrs Insurance coverage will need to be confirmed)  Never done  . PNA vac Low Risk Adult (1 of 2 - PCV13) Never done  . INFLUENZA VACCINE  Completed   Health Maintenance/Date Completed  Last ED visit: 11/12/2019  Last Visit to PCP: 04/13/2020 Next Visit to PCP: 04/28/2020 Dental Exam: "no teeth" Eye Exam: "been a while" Prostate Exam: "been a while" Colonoscopy: unknown Flu Vaccine: completed Pneumonia Vaccine: completed COVID-19 Vaccine: completed primary series (probs redo) Shingrix Vaccine: due   Outpatient Encounter Medications as of 04/18/2020  Medication Sig  . albuterol (VENTOLIN HFA) 108 (90  Base) MCG/ACT inhaler Inhale 2 puffs into the lungs every 6 (six) hours as needed for wheezing or shortness of breath.  Marland Kitchen amLODipine (NORVASC) 5 MG tablet Take 1 tablet (5 mg total) by mouth daily.  . rosuvastatin (CRESTOR) 5 MG tablet Take 1 tablet (5 mg total) by mouth daily.   No facility-administered encounter medications on file as of 04/18/2020.     Assessment and Plan: HTN Pt states he checks his BP at home a couple of times per month but was unable to give any specific values. He stated "it runs good". Pt had a recent telephone visit with Dr. Lanora Manis who had him check his BP during the appt and it was 126/79 (controlled). He currently takes amlodipine 5mg  daily and did not c/o of any hypotensive episodes or other ADRs. Pt does not have a home exercise routine, however he states his job is strenuous so he gets a lot of exercise through work. Encouraged pt to continue exercise and to keep a log of his BP values so he can bring them to his future appointments. Also encouraged pt to increase his vegetable intake and counseled on DASH diet. Continue current regimen.   HLD Pt had a lipid panel obtained on 03/02/2020 - LDL 170, and TC 248 and was started on rosuvastatin 5mg  daily. Consider repeat lipid panel 6-8wks after starting rosuvastatin. Encouraged pt to limit fat/cholesterol intake.   Tobacco use Pt currently smokes 1/4-1/2 ppd which was decreased from 1ppd. He is working  on quitting and sometimes throws away his cigarettes. Pt uses albuterol PRN for wheezing/SOB. Educated pt about OTC nicotine replacement that he could try to help with cravings. Counseled pt on importance of smoking cessation given his comorbidities.    Prediabetes 03/02/20 A1c 6.1%. Encouraged pt to follow low carb/non concentrated sweet diet. Assess effectiveness of lifestyle changes before initiating medication.     Access/Adherence Pt states he is adherent to his medications for which he sets out on the counter to  remember to take. He stated he is coming by today to pick up his medications.    Raiford Noble, PharmD Pharmacy Resident  04/18/2020 11:21 AM

## 2020-04-19 ENCOUNTER — Telehealth: Payer: Self-pay | Admitting: Pharmacist

## 2020-04-19 NOTE — Telephone Encounter (Signed)
04/19/2020 8:09:04 AM - ProAir pending  -- Rhetta Mura - Tuesday, April 19, 2020 8:08 AM --I have received the signed form back from patient, holding for provider to return their portion for ProAir HFA.

## 2020-04-20 ENCOUNTER — Telehealth: Payer: Self-pay | Admitting: Pharmacist

## 2020-04-20 NOTE — Telephone Encounter (Signed)
04/20/2020 3:44:50 PM - ProAir HFA faxed to [employer]  -- Rhetta Mura - Wednesday, April 20, 2020 3:43 PM --I have faxed [employer] application for ProAir HFA-send the income that was in chart, also typed letter explaining that per Mr. Carlena Sax @ Social Security office patient has not worked enough time to draw [employer] or be eligible for Medicare, patient must work 4 years to be eligible.

## 2020-04-28 ENCOUNTER — Encounter: Payer: Self-pay | Admitting: Gerontology

## 2020-04-28 ENCOUNTER — Ambulatory Visit: Payer: Self-pay | Admitting: Gerontology

## 2020-04-28 ENCOUNTER — Other Ambulatory Visit: Payer: Self-pay

## 2020-04-28 VITALS — BP 126/80 | HR 77 | Temp 97.6°F | Wt 178.8 lb

## 2020-04-28 DIAGNOSIS — N529 Male erectile dysfunction, unspecified: Secondary | ICD-10-CM

## 2020-04-28 DIAGNOSIS — F17200 Nicotine dependence, unspecified, uncomplicated: Secondary | ICD-10-CM

## 2020-04-28 DIAGNOSIS — Z0000 Encounter for general adult medical examination without abnormal findings: Secondary | ICD-10-CM

## 2020-04-28 DIAGNOSIS — K649 Unspecified hemorrhoids: Secondary | ICD-10-CM | POA: Insufficient documentation

## 2020-04-28 MED ORDER — WITCH HAZEL-GLYCERIN EX PADS: 1.0000 "application " | MEDICATED_PAD | CUTANEOUS | 12 refills | Status: DC | PRN

## 2020-04-28 NOTE — Patient Instructions (Signed)

## 2020-04-28 NOTE — Progress Notes (Signed)
Established Patient Office Visit  Subjective:  Patient ID: Michael Chaney, male    DOB: Jun 27, 1955  Age: 65 y.o. MRN: [id number]  CC: Difficulties with maintaining erection  HPI Michael Chaney is a 65 year old male who presents for reported erectile dysfunction. The onset was June 2021, when he started a new relationship. The problem has persisted since this time. He has no difficulties becoming erect but reports that he cannot maintain the erection during intercourse. This occurs every time he has intercourse. He denies any other associated symptoms. He reports one sexual partner in last six months. He has not previously experienced this or used any medication for treatment.  He also reports a "bump" on his rectum that is sore. He first noticed this two days ago and the tenderness to the site has persisted. He denies any blood or pus from the area. He has not tried any OTC remedies. He rates the pain as a 2-3/10.   Past Medical History:  Diagnosis Date  . Hyperlipidemia   . Hypertension     No past surgical history on file.  No family history on file.  Social History   Socioeconomic History  . Marital status: Single    Spouse name: Not on file  . Number of children: Not on file  . Years of education: Not on file  . Highest education level: Not on file  Occupational History  . Not on file  Tobacco Use  . Smoking status: Former Smoker    Packs/day: 0.25    Years: 47.00    Pack years: 11.75    Types: Cigarettes  . Smokeless tobacco: Never Used  Substance and Sexual Activity  . Alcohol use: Not Currently  . Drug use: Not Currently  . Sexual activity: Not on file  Other Topics Concern  . Not on file  Social History Narrative  . Not on file   Social Determinants of Health   Financial Resource Strain: Not on file  Food Insecurity: Not on file  Transportation Needs: Not on file  Physical Activity: Not on file  Stress: Not on file  Social Connections: Not on file   Intimate Partner Violence: Not on file    Outpatient Medications Prior to Visit  Medication Sig Dispense Refill  . albuterol (VENTOLIN HFA) 108 (90 Base) MCG/ACT inhaler Inhale 2 puffs into the lungs every 6 (six) hours as needed for wheezing or shortness of breath. 18 g 2  . amLODipine (NORVASC) 5 MG tablet Take 1 tablet (5 mg total) by mouth daily. 30 tablet 1  . rosuvastatin (CRESTOR) 5 MG tablet Take 1 tablet (5 mg total) by mouth daily. 30 tablet 0   No facility-administered medications prior to visit.    Allergies  Allergen Reactions  . Bee Venom Swelling    ROS Review of Systems  Constitutional: Negative.   Respiratory: Negative.   Cardiovascular: Negative.   Gastrointestinal: Positive for rectal pain ("bump that swells up"). Negative for blood in stool.  Genitourinary: Negative for difficulty urinating, penile discharge and penile pain.       Erectile dysfunction   Skin: Negative.   Neurological: Negative.   Psychiatric/Behavioral: Negative.       Objective:    Physical Exam Exam conducted with a chaperone present.  Constitutional:      Appearance: Normal appearance.  Cardiovascular:     Rate and Rhythm: Normal rate and regular rhythm.     Heart sounds: Normal heart sounds.  Pulmonary:  Effort: Pulmonary effort is normal.     Breath sounds: Normal breath sounds.  Abdominal:     General: Bowel sounds are normal.     Palpations: Abdomen is soft.  Genitourinary:    Penis: Normal.      Rectum: External hemorrhoid present.  Neurological:     Mental Status: He is alert.  Psychiatric:        Mood and Affect: Mood normal.     There were no vitals taken for this visit. Wt Readings from Last 3 Encounters:  03/29/20 184 lb (83.5 kg)  03/02/20 180 lb 8 oz (81.9 kg)  11/12/19 192 lb (87.1 kg)     Health Maintenance Due  Topic Date Due  . Hepatitis C Screening  Never done  . COVID-19 Vaccine (1) Never done  . HIV Screening  Never done  .  TETANUS/TDAP  Never done  . COLONOSCOPY (Pts 45-16yrs Insurance coverage will need to be confirmed)  Never done  . PNA vac Low Risk Adult (1 of 2 - PCV13) Never done    There are no preventive care reminders to display for this patient.  No results found for: TSH Lab Results  Component Value Date   WBC 10.0 03/02/2020   HGB 17.3 03/02/2020   HCT 49.4 03/02/2020   MCV 92 03/02/2020   PLT 295 03/02/2020   Lab Results  Component Value Date   NA 140 03/02/2020   K 4.0 03/02/2020   CO2 23 03/02/2020   GLUCOSE 89 03/02/2020   BUN 17 03/02/2020   CREATININE 0.91 03/02/2020   BILITOT 0.7 03/02/2020   ALKPHOS 75 03/02/2020   AST 17 03/02/2020   ALT 13 03/02/2020   PROT 7.7 03/02/2020   ALBUMIN 4.8 03/02/2020   CALCIUM 10.1 03/02/2020   Lab Results  Component Value Date   CHOL 248 (H) 03/02/2020   Lab Results  Component Value Date   HDL 64 03/02/2020   Lab Results  Component Value Date   LDLCALC 170 (H) 03/02/2020   Lab Results  Component Value Date   TRIG 82 03/02/2020   Lab Results  Component Value Date   CHOLHDL 3.9 03/02/2020   Lab Results  Component Value Date   HGBA1C 6.1 (H) 03/02/2020      Assessment & Plan:    1. Erectile dysfunction, unspecified erectile dysfunction type - Ambulatory referral to Urology for further assessment   2. Hemorrhoids, unspecified hemorrhoid type May experience some bleeding from site, especially after defecation.  Ensure regular bowel movements and avoid straining.  - witch hazel-glycerin (TUCKS) pad; Apply 1 application topically as needed for itching.  Dispense: 40 each; Refill: 12  3. Health care maintenance Follow up in 2 weeks for HTN and hyperlipidemia Colonoscopy due - Ambulatory referral to Gastroenterology  4. Smoking Smoking cessation discussed - pt provided with Quit line.  Lung cancer screening previously completed and is followed by pulm.   Follow-up: Return in 2 weeks (05/12/20), or if symptoms  worsen or fail to improve.     Noemi Chapel, RN, BSN, FNP-S

## 2020-05-17 ENCOUNTER — Telehealth: Payer: Self-pay

## 2020-05-17 ENCOUNTER — Other Ambulatory Visit: Payer: Self-pay | Admitting: Gerontology

## 2020-05-17 DIAGNOSIS — R911 Solitary pulmonary nodule: Secondary | ICD-10-CM

## 2020-05-17 NOTE — Telephone Encounter (Signed)
Scheduled f/u for end of March and appt with [facility]

## 2020-05-18 ENCOUNTER — Encounter: Payer: Self-pay | Admitting: Urology

## 2020-05-18 ENCOUNTER — Encounter (INDEPENDENT_AMBULATORY_CARE_PROVIDER_SITE_OTHER): Payer: Self-pay | Admitting: Urology

## 2020-05-18 ENCOUNTER — Other Ambulatory Visit: Payer: Self-pay

## 2020-05-18 NOTE — Progress Notes (Signed)
65 y.o. male referred for erectile dysfunction.  His appointment was at 1:45 and he left at 1:55 stating he had to go and he left without being seen.

## 2020-05-31 ENCOUNTER — Ambulatory Visit: Payer: Self-pay | Admitting: Licensed Clinical Social Worker

## 2020-05-31 ENCOUNTER — Institutional Professional Consult (permissible substitution): Payer: Medicaid Other | Admitting: Licensed Clinical Social Worker

## 2020-05-31 ENCOUNTER — Other Ambulatory Visit: Payer: Self-pay

## 2020-05-31 DIAGNOSIS — F329 Major depressive disorder, single episode, unspecified: Secondary | ICD-10-CM
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
## 2020-06-01 ENCOUNTER — Telehealth: Payer: Self-pay | Admitting: Pharmacist

## 2020-06-01 NOTE — Telephone Encounter (Signed)
Patient failed to provide requested 2022 financial documentation. Unable to determine patient's eligibility status. No additional medication assistance will be provided by [facility] without the required proof of income documentation. Patient notified by letter.  Vonda Henderson [facility] Administrative Assistant 

## 2020-06-07 ENCOUNTER — Institutional Professional Consult (permissible substitution): Payer: Self-pay | Admitting: Licensed Clinical Social Worker

## 2020-06-08 ENCOUNTER — Ambulatory Visit: Payer: Medicaid Other | Admitting: Licensed Clinical Social Worker

## 2020-06-08 ENCOUNTER — Other Ambulatory Visit: Payer: Self-pay

## 2020-06-08 ENCOUNTER — Ambulatory Visit: Payer: Self-pay | Admitting: Licensed Clinical Social Worker

## 2020-06-08 DIAGNOSIS — Z7689 Persons encountering health services in other specified circumstances: Secondary | ICD-10-CM

## 2020-06-08 NOTE — BH Specialist Note (Signed)
Integrated Behavioral Health Follow Up In-Person Visit  MRN: [id number] Name: Michael Chaney   Total time: 30 minutes  Types of Service: General Behavioral Integrated Care (BHI)  Interpretor:No. Interpretor Name and Language:   Patient consents to telephone visit and 2 patient identifiers were used to identify patient  Subjective: Michael Chaney is a 65 y.o. male accompanied by himself Patient was referred by Hurman Horn, NP for mental health. Patient reports the following symptoms/concerns: The patient reports that he has been praying about things that have been going on in his life and feels that God has delivered him from his troubles. He stated that things that happened in his past are in his past now and he is ready to move on. He notes that he was suppose to marry his girlfriend on Monday, but changed his mind. He stated that he thought about it and wanted a love like his mother and father had together. He noted that while he cared for her he was not positive he wanted to spend the rest of his life with her and was not confident he would remain faithful to just her. He discussed relationship stressors. The patient explained that he felt that God has performed a miracle in his life and he is no longer interested in therapy or trying any medications at this time. He stated that he did want another appointment with neurology to discuss his health problems with them. The patient denied any suicidal or homicidal thoughts.  Duration of problem: ; Severity of problem: moderate  Objective: Mood: Euthymic and Affect: Appropriate Risk of harm to self or others: No plan to harm self or others  Life Context: Family and Social: see above School/Work: see above Self-Care: see above Life Changes: see above  Patient and/or Family's Strengths/Protective Factors: Concrete supports in place (healthy food, safe environments, etc.)  Goals Addressed: Patient will: 1.  Reduce symptoms of:  anxiety, depression and insomnia  2.  Increase knowledge and/or ability of: coping skills, healthy habits and self-management skills  3.  Demonstrate ability to: Increase healthy adjustment to current life circumstances  Progress towards Goals: Discontinued  Interventions: Interventions utilized:  Supportive Counseling was utilized by the clinician during today's follow up session. The clinician processed with the patient how they have been doing since the last follow-up session. The clinician provided a space for the patient to ventilate their frustrations regarding their current life circumstances. Clinician measured the patient's anxiety and depression on a numerical scale. Clinician discussed the patients options for treatment and the recommendations from the Integrated Behavioral Health case consultation meeting. Clinician provided the number for Urology so the patient could call and reschedule his missed appointment. Clinician explained that should the patient experience worsening symptoms of anxiety or depression or changed his mind and wanted to discuss his options he could call and [facility] and request another follow-up appointment or he could walk in at [facility] or [facility].   Standardized Assessments completed: GAD-7 and PHQ 9  GAD-7    0 PHQ-9    4   Assessment: Patient currently experiencing see above   Patient may benefit from see above  Plan: 1. Follow up with behavioral health clinician on :  2. Behavioral recommendations:  3. Referral(s): Patient denied referrals 4. "From scale of 1-10, how likely are you to follow plan?":   Judith Part, Student-Social Work

## 2020-06-21 ENCOUNTER — Ambulatory Visit: Payer: Self-pay | Admitting: Gerontology

## 2020-06-22 ENCOUNTER — Encounter: Payer: Self-pay | Admitting: Urology

## 2020-06-22 NOTE — Progress Notes (Signed)
Patient was rescheduled

## 2020-06-23 ENCOUNTER — Other Ambulatory Visit: Payer: Self-pay | Admitting: Gerontology

## 2020-06-23 DIAGNOSIS — I10 Essential (primary) hypertension: Secondary | ICD-10-CM

## 2020-06-26 NOTE — Progress Notes (Shared)
06/27/2020 8:50 PM   Michael Chaney April 22, 1955 [id number]  Referring provider: Rolm Gala, NP [address] [place],  [place] [address] No chief complaint on file.   HPI: Michael Chaney is a 65 y.o. male who presents today for evaluation and management of erectile dysfunction.   - The patient was seen by his PCP on 04/28/2020 for ED. - He reported an onset of ED symptoms starting back in June 2021. - He had difficulty maintaining an erection during intercourse. Denies difficulty achieving erections.  - Patient denied used of medical treatment for ED.  - Today the patient has/does not have difficulty maintaining and achieving erections *** - Denies pain with intercourse and ejaculation ***   (*** SHIM score is phrase .SHIM) >>> for ED  1. Erectile dysfunction   PMH: Past Medical History:  Diagnosis Date  . Hyperlipidemia   . Hypertension     Surgical History: No past surgical history on file.  Home Medications:  Allergies as of 06/27/2020      Reactions   Bee Venom Swelling      Medication List       Accurate as of June 26, 2020  8:50 PM. If you have any questions, ask your nurse or doctor.        albuterol 108 (90 Base) MCG/ACT inhaler Commonly known as: VENTOLIN HFA INHALE 2 PUFFS INTO THE LUNGS EVERY 6 HOURS AS NEEDED FOR WHEEZING OR SHORT OF BREATH   amLODipine 5 MG tablet Commonly known as: NORVASC TAKE ONE TABLET BY MOUTH EVERY DAY   rosuvastatin 5 MG tablet Commonly known as: CRESTOR TAKE ONE TABLET BY MOUTH EVERY DAY   witch hazel-glycerin pad Commonly known as: TUCKS Apply 1 application topically as needed for itching.       Allergies:  Allergies  Allergen Reactions  . Bee Venom Swelling    Family History: No family history on file.  Social History:  reports that he has been smoking cigarettes. He has a 35.25 pack-year smoking history. He has never used smokeless tobacco. He reports previous alcohol use. He  reports previous drug use.   Physical Exam: There were no vitals taken for this visit.  Constitutional:  Alert and oriented, No acute distress. HEENT: [place] AT, moist mucus membranes.  Trachea midline, no masses. Cardiovascular: No clubbing, cyanosis, or edema. Respiratory: Normal respiratory effort, no increased work of breathing. GI: Abdomen is soft, nontender, nondistended, no abdominal masses GU: No CVA tenderness Rectal: Normal sphincter tone, prostate *** cc, smooth no nodules.  Lymph: No cervical or inguinal lymphadenopathy. Skin: No rashes, bruises or suspicious lesions. Neurologic: Grossly intact, no focal deficits, moving all 4 extremities. Psychiatric: Normal mood and affect.  Laboratory Data:  Lab Results  Component Value Date   CREATININE 0.91 03/02/2020    No results found for: PSA  No results found for: TESTOSTERONE  Lab Results  Component Value Date   HGBA1C 6.1 (H) 03/02/2020    Urinalysis   Pertinent Imaging: *** No results found for this or any previous visit.  No results found for this or any previous visit.  No results found for this or any previous visit.  No results found for this or any previous visit.  No results found for this or any previous visit.  No results found for this or any previous visit.  No results found for this or any previous visit.  No results found for this or any previous visit.   Assessment & Plan:  No follow-ups on file.  [facility] [address] [place], [place] [address] [phone]

## 2020-06-27 ENCOUNTER — Ambulatory Visit: Payer: Self-pay | Admitting: Urology

## 2020-06-28 ENCOUNTER — Ambulatory Visit: Payer: Self-pay | Admitting: Urology

## 2020-06-28 ENCOUNTER — Encounter: Payer: Self-pay | Admitting: Urology

## 2020-07-09 NOTE — Progress Notes (Addendum)
07/11/2020  8:56 AM   Michael Chaney 02/07/1956 [id number]  Referring provider: No referring provider defined for this encounter. Chief Complaint  Patient presents with  . Erectile Dysfunction    HPI: Michael Chaney is a 65 y.o. male who presents today for evaluation and management of erectile dysfunction.  - No prior urological history  - Today the patient reports that he has been having difficulty achieving and maintaining an erection since about June of 2021. Patient reported that he is having an erection that is firm enough for penetration about 60% of the time during intercourse.  - Patient denies any pain with erections or abnormal curvature  - Patient has not been given any medication or tried any medication for ED  - Patient is a current smoker, about 3/4 packs a day - Had smoked for 8 years 1 pack a day, then stopped for 10 years, and has just started back up again  - Other risk factors were HTN, hyperlipidemia, and anti-HTN medications  PMH: Past Medical History:  Diagnosis Date  . Hyperlipidemia   . Hypertension     Surgical History: History reviewed. No pertinent surgical history.  Home Medications:  Allergies as of 07/11/2020      Reactions   Bee Venom Swelling      Medication List       Accurate as of July 11, 2020  8:56 AM. If you have any questions, ask your nurse or doctor.        albuterol 108 (90 Base) MCG/ACT inhaler Commonly known as: VENTOLIN HFA INHALE 2 PUFFS INTO THE LUNGS EVERY 6 HOURS AS NEEDED FOR WHEEZING OR SHORT OF BREATH   amLODipine 5 MG tablet Commonly known as: NORVASC TAKE ONE TABLET BY MOUTH EVERY DAY   rosuvastatin 5 MG tablet Commonly known as: CRESTOR TAKE ONE TABLET BY MOUTH EVERY DAY   witch hazel-glycerin pad Commonly known as: TUCKS Apply 1 application topically as needed for itching.       Allergies: Allergies  Allergen Reactions  . Bee Venom Swelling    Family History: History reviewed. No  pertinent family history.  Social History:   reports that he has been smoking cigarettes. He has a 35.25 pack-year smoking history. He has never used smokeless tobacco. He reports previous alcohol use. He reports previous drug use.  ROS: Pertinent ROS in HPI.  Physical Exam: Pulse 69   Ht '5\' 11"'  (1.803 m)   Wt 174 lb (78.9 kg)   BMI 24.27 kg/m   BP 139/84 Constitutional:  Alert and oriented, No acute distress. HEENT: [place] AT, moist mucus membranes.  Trachea midline, no masses. Cardiovascular: No clubbing, cyanosis, or edema. Respiratory: Normal respiratory effort, no increased work of breathing. GU: Phallus uncircumcised, no lesions, no plaques, left testes was atrophic approximately 10 cc volume, right testes normal Skin: No rashes, bruises or suspicious lesions. Neurologic: Grossly intact, no focal deficits, moving all 4 extremities. Psychiatric: Normal mood and affect.  Laboratory Data:  Lipid panel  Result Value Ref Range   Cholesterol, Total 248 (H) 100 - 199 mg/dL   Triglycerides 82 0 - 149 mg/dL   HDL 64 >39 mg/dL   VLDL Cholesterol Cal 14 5 - 40 mg/dL   LDL Chol Calc (NIH) 170 (H) 0 - 99 mg/dL   Chol/HDL Ratio 3.9 0.0 - 5.0 ratio  Hemoglobin A1c  Result Value Ref Range   Hgb A1c MFr Bld 6.1 (H) 4.8 - 5.6 %   Est. average glucose Bld gHb  Est-mCnc 128 mg/dL  Comp Met (CMET)  Result Value Ref Range   Glucose 89 65 - 99 mg/dL   BUN 17 8 - 27 mg/dL   Creatinine, Ser 0.91 0.76 - 1.27 mg/dL   GFR calc non Af Amer 88 >59 mL/min/1.73   GFR calc Af Amer 102 >59 mL/min/1.73   BUN/Creatinine Ratio 19 10 - 24   Sodium 140 134 - 144 mmol/L   Potassium 4.0 3.5 - 5.2 mmol/L   Chloride 100 96 - 106 mmol/L   CO2 23 20 - 29 mmol/L   Calcium 10.1 8.6 - 10.2 mg/dL   Total Protein 7.7 6.0 - 8.5 g/dL   Albumin 4.8 3.8 - 4.8 g/dL   Globulin, Total 2.9 1.5 - 4.5 g/dL   Albumin/Globulin Ratio 1.7 1.2 - 2.2   Bilirubin Total 0.7 0.0 - 1.2 mg/dL   Alkaline Phosphatase 75 44 - 121  IU/L   AST 17 0 - 40 IU/L   ALT 13 0 - 44 IU/L  CBC w/Diff  Result Value Ref Range   WBC 10.0 3.4 - 10.8 x10E3/uL   RBC 5.39 4.14 - 5.80 x10E6/uL   Hemoglobin 17.3 13.0 - 17.7 g/dL   Hematocrit 49.4 37.5 - 51.0 %   MCV 92 79 - 97 fL   MCH 32.1 26.6 - 33.0 pg   MCHC 35.0 31.5 - 35.7 g/dL   RDW 12.4 11.6 - 15.4 %   Platelets 295 150 - 450 x10E3/uL   Neutrophils 72 Not Estab. %   Lymphs 17 Not Estab. %   Monocytes 8 Not Estab. %   Eos 1 Not Estab. %   Basos 1 Not Estab. %   Neutrophils Absolute 7.3 (H) 1.4 - 7.0 x10E3/uL   Lymphocytes Absolute 1.7 0.7 - 3.1 x10E3/uL   Monocytes Absolute 0.8 0.1 - 0.9 x10E3/uL   EOS (ABSOLUTE) 0.1 0.0 - 0.4 x10E3/uL   Basophils Absolute 0.1 0.0 - 0.2 x10E3/uL   Immature Granulocytes 1 Not Estab. %   Immature Grans (Abs) 0.1 0.0 - 0.1 x10E3/uL    Lab Results  Component Value Date   CREATININE 0.91 03/02/2020    Lab Results  Component Value Date   HGBA1C 6.1 (H) 03/02/2020    Assessment & Plan:   1. Erectile dysfunction  - We discussed the pathophysiology of erectile dysfunction today.  No contraindication to PDE 5 inhibitors which we discussed as first-line treatment  - In terms of PDE 5 inhibitors, we discussed contraindications for this medication as well as common side effects. Patient was counseled on optimal use. All of his questions were answered in detail.   - Sildinafil 20 mg, 2-5 tabs 1 hour prior to intercourse  Follow Up:  - Patient declined office follow-up and will call back if sildenafil is ineffective  I, Ardyth Gal, am acting as a scribe for Dr. John Giovanni.   I have reviewed the above documentation for accuracy and completeness, and I agree with the above.    Abbie Sons, [facility] [address], [place], [place] [address] [phone]

## 2020-07-11 ENCOUNTER — Encounter: Payer: Self-pay | Admitting: Urology

## 2020-07-11 ENCOUNTER — Other Ambulatory Visit: Payer: Self-pay

## 2020-07-11 ENCOUNTER — Ambulatory Visit (INDEPENDENT_AMBULATORY_CARE_PROVIDER_SITE_OTHER): Payer: 59 | Admitting: Urology

## 2020-07-11 ENCOUNTER — Ambulatory Visit: Payer: Self-pay | Admitting: Physician Assistant

## 2020-07-11 VITALS — BP 139/84 | HR 69 | Ht 71.0 in | Wt 174.0 lb

## 2020-07-11 DIAGNOSIS — N5201 Erectile dysfunction due to arterial insufficiency: Secondary | ICD-10-CM | POA: Diagnosis not present

## 2020-07-11 MED ORDER — SILDENAFIL CITRATE 20 MG PO TABS: ORAL_TABLET | ORAL | 1 refills | Status: DC

## 2020-07-25 ENCOUNTER — Other Ambulatory Visit: Payer: Self-pay

## 2020-08-04 ENCOUNTER — Telehealth: Payer: Self-pay | Admitting: Pharmacy Technician

## 2020-08-04 NOTE — Telephone Encounter (Signed)
Patient has prescription drug coverage with [employer].  No longer meets [facility]'s eligibility criteria.  Pt notified by letter.  Michael Chaney Care Manager [facility]   [address] [place], [place]  [address]  Aug 04, 2020    [name] [address] [place], [place]  [address]  Dear Michael Chaney:  This is to inform you that you are no longer eligible to receive medication assistance at [facility].  The reason(s) are:    _____Your total gross monthly household income exceeds 250% of the Federal Poverty Level.   _____Tangible assets (savings, checking, stocks/bonds, pension, retirement, etc.) exceeds our limit  _____You are eligible to receive benefits from [facility], [facility] or HIV Medication            Assistance program _____You are eligible to receive benefits from a Medicare Part "D" plan __X__You have prescription insurance with [employer] _____You are not an [employer] resident _____Failure to provide all requested proof of income information for 2022.    We regret that we are unable to help you at this time.  If your prescription coverage is terminated, please contact [facility], so that we may reassess your eligibility for our program.  If you have questions, we may be contacted at [phone].  Thank you,  [facility]

## 2020-08-18 ENCOUNTER — Telehealth: Payer: Self-pay | Admitting: Licensed Clinical Social Worker

## 2020-08-18 NOTE — Telephone Encounter (Signed)
Patient called and stated he needed to talk to someone. I informed the patient that I could not have a therapy session until my license was issued. I gave him the contact information for [facility] and explained that he could also go to the nearest emergency department.The patient stated he was not thinking of hurting himself. He noted he just wanted someone to talk to. I made the patient a telephone appointment to complete his Social Determinates of Health on 08/23/2020 at 2:00 PM.

## 2020-08-30 ENCOUNTER — Ambulatory Visit: Payer: Self-pay | Admitting: Licensed Clinical Social Worker

## 2020-08-30 ENCOUNTER — Ambulatory Visit: Payer: Medicaid Other | Admitting: Licensed Clinical Social Worker

## 2020-08-30 ENCOUNTER — Other Ambulatory Visit: Payer: Self-pay

## 2020-08-30 DIAGNOSIS — F329 Major depressive disorder, single episode, unspecified: Secondary | ICD-10-CM

## 2020-08-30 NOTE — BH Specialist Note (Signed)
Called patient to check in with him and discuss community resources. The patient stated he is doing better, but is concerned he will not be able to return to [facility] for care because he may have insurance. Set appointment for Tuesday 14, 2022 at 9:00 AM  for further referrals and transition of care.

## 2020-09-06 ENCOUNTER — Other Ambulatory Visit: Payer: Self-pay

## 2020-09-06 ENCOUNTER — Ambulatory Visit: Payer: Self-pay | Admitting: Licensed Clinical Social Worker

## 2020-09-06 ENCOUNTER — Ambulatory Visit: Payer: Medicaid Other | Admitting: Licensed Clinical Social Worker

## 2020-09-06 DIAGNOSIS — F329 Major depressive disorder, single episode, unspecified: Secondary | ICD-10-CM

## 2020-09-06 NOTE — BH Specialist Note (Signed)
Integrated Behavioral Health Follow Up In-Person Visit  MRN: [id number] Name: [name]    Total time: 30 minutes   Types of Service: Telephone visit Patient consents to telephone visit and 2 patient identifiers were used to identify patient   Interpretor:No. Interpretor Name and Language:   Subjective: Michael Chaney is a 65 y.o. male accompanied by  himself Patient was referred by Hurman Horn, NP for mental health. Patient reports the following symptoms/concerns: The patient notes he has been doing about the same since his last follow up session. He stated that he now has insurance and can no longer be seen at the [facility]. He requested a referral to another mental health provider. Patient noted that his phone was messed up and he was unable to make outgoing telephone calls. He discussed how he was accepting that he could not change his past and was ready to move forward with his life. The patient denied any suicidal or homicidal thoughts.  Severity of problem: moderate  Objective: Mood: Euthymic and Affect: Appropriate Risk of harm to self or others: No plan to harm self or others  Life Context: Family and Social: see above    School/Work: see above Self-Care: see above Life Changes: see above  Patient and/or Family's Strengths/Protective Factors: Social connections and Sense of purpose  Goals Addressed: Patient will:  Reduce symptoms of: depression, insomnia, and stress   Increase knowledge and/or ability of: coping skills, healthy habits, and stress reduction   Demonstrate ability to: Increase healthy adjustment to current life circumstances  Progress towards Goals: Other  Interventions: Interventions utilized:  Supportive Counseling and Link to [employer]. was utilized by the clinician during today's follow up session. The clinician processed with the patient how they have been doing since the last follow-up session. The clinician  provided a space for the patient to ventilate their frustrations regarding their current life circumstances. The clinician encouraged the patient to continue to utilize their coping skills to deal with their current life circumstances. Clinician referred patient to [facility] and explained that he could walk-in to establish care on Mondays, Wednesdays or Fridays.   Standardized Assessments completed: Patient declined screening  Patient and/or Family Response: Patient noted he would follow-up with [facility]  Patient Centered Plan: Patient is on the following Treatment Plan(s): Patient will follow-up with [facility]  Assessment: Patient currently experiencing see above.   Patient may benefit from see above.  Plan: Follow up with behavioral health clinician on :  Behavioral recommendations:  Referral(s): Community Mental [facility] Services (LME/Outside Clinic) "From scale of 1-10, how likely are you to follow plan?": 8  Ronny Korff Orma Render, LCSWA

## 2020-09-12 DIAGNOSIS — K4091 Unilateral inguinal hernia, without obstruction or gangrene, recurrent: Secondary | ICD-10-CM | POA: Diagnosis not present

## 2020-09-12 DIAGNOSIS — R1030 Lower abdominal pain, unspecified: Secondary | ICD-10-CM | POA: Diagnosis not present

## 2020-09-12 DIAGNOSIS — R059 Cough, unspecified: Secondary | ICD-10-CM | POA: Diagnosis not present

## 2020-09-13 ENCOUNTER — Other Ambulatory Visit: Payer: Self-pay | Admitting: *Deleted

## 2020-09-13 MED ORDER — SILDENAFIL CITRATE 20 MG PO TABS: ORAL_TABLET | ORAL | 1 refills | Status: DC

## 2020-09-14 ENCOUNTER — Other Ambulatory Visit: Payer: Self-pay

## 2020-09-15 ENCOUNTER — Other Ambulatory Visit: Payer: Self-pay

## 2020-09-16 ENCOUNTER — Emergency Department
Admission: EM | Admit: 2020-09-16 | Discharge: 2020-09-16 | Disposition: A | Discharge status: Home / Self Care | Payer: 59 | Attending: Emergency Medicine | Admitting: Emergency Medicine

## 2020-09-16 ENCOUNTER — Other Ambulatory Visit: Payer: Self-pay

## 2020-09-16 ENCOUNTER — Emergency Department: Payer: 59

## 2020-09-16 DIAGNOSIS — I10 Essential (primary) hypertension: Secondary | ICD-10-CM | POA: Insufficient documentation

## 2020-09-16 DIAGNOSIS — Z79899 Other long term (current) drug therapy: Secondary | ICD-10-CM | POA: Diagnosis not present

## 2020-09-16 DIAGNOSIS — R109 Unspecified abdominal pain: Secondary | ICD-10-CM | POA: Diagnosis not present

## 2020-09-16 DIAGNOSIS — F17210 Nicotine dependence, cigarettes, uncomplicated: Secondary | ICD-10-CM | POA: Diagnosis not present

## 2020-09-16 DIAGNOSIS — K4090 Unilateral inguinal hernia, without obstruction or gangrene, not specified as recurrent: Secondary | ICD-10-CM | POA: Diagnosis not present

## 2020-09-16 DIAGNOSIS — R1909 Other intra-abdominal and pelvic swelling, mass and lump: Secondary | ICD-10-CM | POA: Diagnosis present

## 2020-09-16 DIAGNOSIS — R1030 Lower abdominal pain, unspecified: Secondary | ICD-10-CM | POA: Diagnosis not present

## 2020-09-16 LAB — URINALYSIS, COMPLETE (UACMP) WITH MICROSCOPIC
Bacteria, UA: NONE SEEN
Bilirubin Urine: NEGATIVE
Glucose, UA: NEGATIVE mg/dL
Hgb urine dipstick: NEGATIVE
Ketones, ur: NEGATIVE mg/dL
Leukocytes,Ua: NEGATIVE
Nitrite: NEGATIVE
Protein, ur: NEGATIVE mg/dL
Specific Gravity, Urine: 1.018 (ref 1.005–1.030)
Squamous Epithelial / HPF: NONE SEEN (ref 0–5)
WBC, UA: NONE SEEN WBC/hpf (ref 0–5)
pH: 6 (ref 5.0–8.0)

## 2020-09-16 LAB — COMPREHENSIVE METABOLIC PANEL
ALT: 20 U/L (ref 0–44)
AST: 22 U/L (ref 15–41)
Albumin: 3.9 g/dL (ref 3.5–5.0)
Alkaline Phosphatase: 53 U/L (ref 38–126)
Anion gap: 9 (ref 5–15)
BUN: 16 mg/dL (ref 8–23)
CO2: 25 mmol/L (ref 22–32)
Calcium: 9.3 mg/dL (ref 8.9–10.3)
Chloride: 105 mmol/L (ref 98–111)
Creatinine, Ser: 0.79 mg/dL (ref 0.61–1.24)
GFR, Estimated: >60 mL/min (ref >60)
Glucose, Bld: 104 mg/dL — ABNORMAL HIGH (ref 70–99)
Potassium: 3.5 mmol/L (ref 3.5–5.1)
Sodium: 139 mmol/L (ref 135–145)
Total Bilirubin: 1.1 mg/dL (ref 0.3–1.2)
Total Protein: 6.9 g/dL (ref 6.5–8.1)

## 2020-09-16 LAB — CBC
HCT: 41.9 % (ref 39.0–52.0)
Hemoglobin: 14.3 g/dL (ref 13.0–17.0)
MCH: 31.3 pg (ref 26.0–34.0)
MCHC: 34.1 g/dL (ref 30.0–36.0)
MCV: 91.7 fL (ref 80.0–100.0)
Platelets: 255 10*3/uL (ref 150–400)
RBC: 4.57 MIL/uL (ref 4.22–5.81)
RDW: 13.3 % (ref 11.5–15.5)
WBC: 9.6 10*3/uL (ref 4.0–10.5)
nRBC: 0 % (ref 0.0–0.2)

## 2020-09-16 LAB — LIPASE, BLOOD: Lipase: 59 U/L — ABNORMAL HIGH (ref 11–51)

## 2020-09-16 MED ORDER — IBUPROFEN 600 MG PO TABS
600.0000 mg | ORAL_TABLET | Freq: Three times a day (TID) | ORAL | 0 refills | Status: DC | PRN
  Filled 2020-09-16: qty 20, 7d supply, fill #0

## 2020-09-16 NOTE — ED Triage Notes (Signed)
Pt states he has had a right inguinal hernia since the 1980's and in the past 24hr it has gotten larger and is causing some discomfort today. Pt is in NAD, ambulatory with a steady gait.

## 2020-09-16 NOTE — Discharge Instructions (Addendum)
Call the office of Dr. Maia Plan to set up an appointment within the next week.  No heavy lifting greater than 10 pounds until you are seen and cleared by surgery.  I would take the next 24 hours off of work.  I provided a work note.  If you notice swelling and pain in the area of the hernia, I recommend laying down, lifting her legs, and trying to relax her stomach and put slight pressure on the area.  This should reduce it.

## 2020-09-16 NOTE — ED Notes (Signed)
See triage note  Presents with increased pain and swelling to groin area  States area has been there for some time now larger and causing more pain

## 2020-09-16 NOTE — ED Provider Notes (Signed)
[facility] Emergency Department Provider Note  ____________________________________________   Event Date/Time   First MD Initiated Contact with Patient 09/16/20 1054     (approximate)  I have reviewed the triage vital signs and the nursing notes.   HISTORY  Chief Complaint Inguinal Hernia    HPI Michael Chaney is a 65 y.o. male with history of hypertension, hyperlipidemia, here with pain over his right inguinal hernia.  The patient states that over the last several years, he has noticed a small bulge in his right groin area.  It has not caused any issues.  However, over the last week, he has noticed increasing pain and swelling in the area.  He works with frequent heavy lifting and twisting, and has noticed that when he is at work, he has had increasing swelling and pain in the area.  His pain improves when he is not lifting.  Denies any nausea or vomiting.  No constipation.  He is passing gas.  He also has some chronic lower back pain which has been ongoing for several years, and is not acutely worsened.  No lower extremity weakness or numbness.  He has never seen a Careers adviser for this.  No other complaints.    Past Medical History:  Diagnosis Date   Hyperlipidemia    Hypertension     Patient Active Problem List   Diagnosis Date Noted   Erectile dysfunction 04/28/2020   Hemorrhoid 04/28/2020   Elevated lipids 04/13/2020   Prediabetes 04/13/2020   Essential hypertension 03/02/2020   Smoking 03/02/2020   Encounter to establish care 03/02/2020    History reviewed. No pertinent surgical history.  Prior to Admission medications   Medication Sig Start Date End Date Taking? Authorizing Provider  ibuprofen (ADVIL) 600 MG tablet Take 1 tablet (600 mg total) by mouth every 8 (eight) hours as needed for moderate pain. 09/16/20  Yes Shaune Pollack, MD  albuterol (VENTOLIN HFA) 108 (90 Base) MCG/ACT inhaler INHALE 2 PUFFS INTO THE LUNGS EVERY 6 HOURS AS  NEEDED FOR WHEEZING OR SHORT OF BREATH 04/13/20 04/13/21  Iloabachie, Chioma E, NP  amLODipine (NORVASC) 5 MG tablet TAKE ONE TABLET BY MOUTH EVERY DAY 06/23/20 06/23/21  Iloabachie, Chioma E, NP  rosuvastatin (CRESTOR) 5 MG tablet TAKE ONE TABLET BY MOUTH EVERY DAY 04/13/20 07/23/20  Iloabachie, Chioma E, NP  sildenafil (REVATIO) 20 MG tablet Take 2-5 tablets 1 hour prior to intercourse as needed. 09/13/20   Stoioff, Verna Czech, MD  witch hazel-glycerin (TUCKS) pad Apply 1 application topically as needed for itching. 04/28/20   Iloabachie, Chioma E, NP    Allergies Bee venom  No family history on file.  Social History Social History   Tobacco Use   Smoking status: Every Day    Packs/day: 0.75    Years: 47.00    Pack years: 35.25    Types: Cigarettes   Smokeless tobacco: Never  Substance Use Topics   Alcohol use: Not Currently   Drug use: Not Currently    Review of Systems  Review of Systems  Constitutional:  Negative for chills and fever.  HENT:  Negative for sore throat.   Respiratory:  Negative for shortness of breath.   Cardiovascular:  Negative for chest pain.  Gastrointestinal:  Positive for abdominal pain.  Genitourinary:  Negative for flank pain.  Musculoskeletal:  Positive for back pain. Negative for neck pain.  Skin:  Negative for rash and wound.  Allergic/Immunologic: Negative for immunocompromised state.  Neurological:  Negative for weakness  and numbness.  Hematological:  Does not bruise/bleed easily.  All other systems reviewed and are negative.   ____________________________________________  PHYSICAL EXAM:      VITAL SIGNS: ED Triage Vitals  Enc Vitals Group     BP 09/16/20 1005 (!) 144/90     Pulse Rate 09/16/20 1005 68     Resp 09/16/20 1005 15     Temp 09/16/20 1005 98.4 F (36.9 C)     Temp Source 09/16/20 1005 Oral     SpO2 09/16/20 1005 99 %     Weight 09/16/20 1008 160 lb (72.6 kg)     Height 09/16/20 1008 5\' 11"  (1.803 m)     Head Circumference --       Peak Flow --      Pain Score 09/16/20 1008 0     Pain Loc --      Pain Edu? --      Excl. in GC? --      Physical Exam Vitals and nursing note reviewed.  Constitutional:      General: He is not in acute distress.    Appearance: He is well-developed.  HENT:     Head: Normocephalic and atraumatic.  Eyes:     Conjunctiva/sclera: Conjunctivae normal.  Cardiovascular:     Rate and Rhythm: Normal rate and regular rhythm.     Heart sounds: Normal heart sounds.  Pulmonary:     Effort: Pulmonary effort is normal. No respiratory distress.     Breath sounds: No wheezing.  Abdominal:     General: There is no distension.  Genitourinary:    Comments: Easily reduced, soft, nontender and direct right inguinal hernia.  Testes descended bilaterally with normal lie. Musculoskeletal:     Cervical back: Neck supple.  Skin:    General: Skin is warm.     Capillary Refill: Capillary refill takes less than 2 seconds.     Findings: No rash.  Neurological:     Mental Status: He is alert and oriented to person, place, and time.     Motor: No abnormal muscle tone.      ____________________________________________   LABS (all labs ordered are listed, but only abnormal results are displayed)  Labs Reviewed  LIPASE, BLOOD - Abnormal; Notable for the following components:      Result Value   Lipase 59 (*)    All other components within normal limits  COMPREHENSIVE METABOLIC PANEL - Abnormal; Notable for the following components:   Glucose, Bld 104 (*)    All other components within normal limits  URINALYSIS, COMPLETE (UACMP) WITH MICROSCOPIC - Abnormal; Notable for the following components:   Color, Urine YELLOW (*)    APPearance CLOUDY (*)    All other components within normal limits  CBC    ____________________________________________  EKG:  ________________________________________  RADIOLOGY All imaging, including plain films, CT scans, and ultrasounds, independently reviewed  by me, and interpretations confirmed via formal radiology reads.  ED MD interpretation:   DG abdomen: no acute abnormality  Official radiology report(s): DG Abd 2 Views  Result Date: 09/16/2020 CLINICAL DATA:  Diffuse abdomen and back pain 1 day after moving boxes. History of inguinal hernia. EXAM: ABDOMEN - 2 VIEW COMPARISON:  None. FINDINGS: Normal bowel gas pattern. Bilateral nipple shadows. Mild thoracolumbar scoliosis. Lower thoracic spine degenerative changes. IMPRESSION: No acute abnormality. Electronically Signed   By: 09/18/2020 M.D.   On: 09/16/2020 12:02    ____________________________________________  PROCEDURES   Procedure(s) performed (including Critical  Care):  Procedures  ____________________________________________  INITIAL IMPRESSION / MDM / ASSESSMENT AND PLAN / ED COURSE  As part of my medical decision making, I reviewed the following data within the electronic MEDICAL RECORD NUMBER Nursing notes reviewed and incorporated, Old chart reviewed, Notes from prior ED visits, and [place] Controlled Substance Database       *Michael Chaney was evaluated in Emergency Department on 09/16/2020 for the symptoms described in the history of present illness. He was evaluated in the context of the global COVID-19 pandemic, which necessitated consideration that the patient might be at risk for infection with the SARS-CoV-2 virus that causes COVID-19. Institutional protocols and algorithms that pertain to the evaluation of patients at risk for COVID-19 are in a state of rapid change based on information released by regulatory bodies including the CDC and federal and state organizations. These policies and algorithms were followed during the patient's care in the ED.  Some ED evaluations and interventions may be delayed as a result of limited staffing during the pandemic.*     Medical Decision Making: 65 year old here with symptomatic indirect inguinal hernia.  This was easily reduced at  bedside.  Plain film showed no evidence of obstruction.  UA without signs of UTI.  CBC, CMP unremarkable.  Lipase is only slightly elevated but he has no abdominal pain, nausea, or vomiting.  His hernia is reproducible, and easily reduced.  I discussed that he likely would benefit from surgical management of this and refer him to surgery as an outpatient.  Otherwise, I encouraged him to decrease heavy lifting, take a stool softener, and treat symptomatically.  Return precautions given.  ____________________________________________  FINAL CLINICAL IMPRESSION(S) / ED DIAGNOSES  Final diagnoses:  Indirect inguinal hernia  Abdominal pain     MEDICATIONS GIVEN DURING THIS VISIT:  Medications - No data to display   ED Discharge Orders          Ordered    ibuprofen (ADVIL) 600 MG tablet  Every 8 hours PRN        09/16/20 1210             Note:  This document was prepared using Dragon voice recognition software and may include unintentional dictation errors.   Shaune Pollack, MD 09/16/20 1300

## 2020-09-19 ENCOUNTER — Other Ambulatory Visit: Payer: Self-pay

## 2020-09-20 ENCOUNTER — Ambulatory Visit: Payer: Self-pay | Admitting: General Surgery

## 2020-09-20 DIAGNOSIS — K4020 Bilateral inguinal hernia, without obstruction or gangrene, not specified as recurrent: Secondary | ICD-10-CM | POA: Diagnosis not present

## 2020-09-20 NOTE — H&P (View-Only) (Signed)
PATIENT PROFILE: Michael Chaney is a 65 y.o. male who presents to the Clinic for consultation at the request of Dr. Erma Heritage for evaluation of right inguinal hernia.  PCP:  None  HISTORY OF PRESENT ILLNESS: Michael Chaney reports he was told that he had a inguinal hernia since 20 years ago.  He reports that it was not causing any problems until the last 2 or 3 weeks.  He reported that he is having pain in the right groin.  Pain is exacerbated by heavy lifting.  He sees a bulging diet is able to be reduced.  There is no pain radiation.  There is no alleviating factors.  Patient denies any episode of abdominal distention nausea or vomiting.   PROBLEM LIST: Bilateral inguinal hernia Hypertension  GENERAL REVIEW OF SYSTEMS:   General ROS: negative for - chills, fatigue, fever, weight gain or weight loss Allergy and Immunology ROS: negative for - hives  Hematological and Lymphatic ROS: negative for - bleeding problems or bruising, negative for palpable nodes Endocrine ROS: negative for - heat or cold intolerance, hair changes Respiratory ROS: negative for - cough, shortness of breath or wheezing Cardiovascular ROS: no chest pain or palpitations GI ROS: negative for nausea, vomiting, abdominal pain, diarrhea, constipation Musculoskeletal ROS: negative for - joint swelling or muscle pain Neurological ROS: negative for - confusion, syncope Dermatological ROS: negative for pruritus and rash Psychiatric: negative for anxiety, depression, difficulty sleeping and memory loss  MEDICATIONS: Current Outpatient Medications  Medication Sig Dispense Refill   albuterol 90 mcg/actuation inhaler as needed     amLODIPine (NORVASC) 5 MG tablet Take 1 tablet by mouth once daily     ibuprofen (MOTRIN) 600 MG tablet Take by mouth as needed     sildenafil (REVATIO) 20 mg tablet as needed     No current facility-administered medications for this visit.    ALLERGIES: Venom-honey bee  PAST MEDICAL  HISTORY: Past Medical History:  Diagnosis Date   Asthma    Hypertension     PAST SURGICAL HISTORY: History reviewed. No pertinent surgical history.   FAMILY HISTORY: Family History  Problem Relation Age of Onset   Asthma Mother    COPD Father    Emphysema Father    Incontinence Sister    COPD Sister    Heart disease Brother      SOCIAL HISTORY: Social History   Socioeconomic History   Marital status: Married  Tobacco Use   Smoking status: Current Every Day Smoker   Smokeless tobacco: Never Used  Building services engineer Use: Never used  Substance and Sexual Activity   Alcohol use: Never   Drug use: Yes    Comment: marijuana    PHYSICAL EXAM: Vitals:   09/20/20 1105  BP: 124/73  Pulse: 62   Body mass index is 22.32 kg/m. Weight: 72.6 kg (160 lb)   GENERAL: Alert, active, oriented x3  HEENT: Pupils equal reactive to light. Extraocular movements are intact. Sclera clear. Palpebral conjunctiva normal red color.Pharynx clear.  NECK: Supple with no palpable mass and no adenopathy.  LUNGS: Sound clear with no rales rhonchi or wheezes.  HEART: Regular rhythm S1 and S2 without murmur.  ABDOMEN: Soft and depressible, nontender with no palpable mass, no hepatomegaly.  Bilateral inguinal hernia were appreciated on the physical exam.  Right bigger than left.  Both are reducible.  EXTREMITIES: Well-developed well-nourished symmetrical with no dependent edema.  NEUROLOGICAL: Awake alert oriented, facial expression symmetrical, moving all extremities.  REVIEW OF DATA:  I have reviewed the following data today: No visits with results within 3 Month(s) from this visit.  Latest known visit with results is:  No results found for any previous visit.  I personally evaluated the labs done at last ED visit.  CBC and CMP within normal limits.  ASSESSMENT: Michael Chaney is a 64 y.o. male presenting for consultation for bilateral inguinal hernia.    The patient presents with a  symptomatic, reducible bilateral inguinal hernia. Patient was oriented about the diagnosis of inguinal hernia and its implication. The patient was oriented about the treatment alternatives (observation vs surgical repair). Due to patient symptoms, repair is recommended. Patient oriented about the surgical procedure, the use of mesh and its risk of complications such as: infection, bleeding, injury to vas deference, vasculature and testicle, injury to bowel or bladder, and chronic pain.   Non-recurrent bilateral inguinal hernia without obstruction or gangrene [K40.20]  PLAN: Robotic assisted laparoscopic bilateral inguinal hernia repair with mesh ([id number]) CBC, CMP (done 09/16/20) Avoid taking aspirin 5 days before procedure Contact us if has any question or concern.   Patient verbalized understanding, all questions were answered, and were agreeable with the plan outlined above.    Michael Honaker Cintron-Diaz, MD  Electronically signed by Yarima Penman Cintron-Diaz, MD  

## 2020-09-20 NOTE — H&P (Signed)
PATIENT PROFILE: Herberto Ledwell is a 65 y.o. male who presents to the Clinic for consultation at the request of Dr. Erma Heritage for evaluation of right inguinal hernia.  PCP:  None  HISTORY OF PRESENT ILLNESS: Mr. Ishida reports he was told that he had a inguinal hernia since 20 years ago.  He reports that it was not causing any problems until the last 2 or 3 weeks.  He reported that he is having pain in the right groin.  Pain is exacerbated by heavy lifting.  He sees a bulging diet is able to be reduced.  There is no pain radiation.  There is no alleviating factors.  Patient denies any episode of abdominal distention nausea or vomiting.   PROBLEM LIST: Bilateral inguinal hernia Hypertension  GENERAL REVIEW OF SYSTEMS:   General ROS: negative for - chills, fatigue, fever, weight gain or weight loss Allergy and Immunology ROS: negative for - hives  Hematological and Lymphatic ROS: negative for - bleeding problems or bruising, negative for palpable nodes Endocrine ROS: negative for - heat or cold intolerance, hair changes Respiratory ROS: negative for - cough, shortness of breath or wheezing Cardiovascular ROS: no chest pain or palpitations GI ROS: negative for nausea, vomiting, abdominal pain, diarrhea, constipation Musculoskeletal ROS: negative for - joint swelling or muscle pain Neurological ROS: negative for - confusion, syncope Dermatological ROS: negative for pruritus and rash Psychiatric: negative for anxiety, depression, difficulty sleeping and memory loss  MEDICATIONS: Current Outpatient Medications  Medication Sig Dispense Refill   albuterol 90 mcg/actuation inhaler as needed     amLODIPine (NORVASC) 5 MG tablet Take 1 tablet by mouth once daily     ibuprofen (MOTRIN) 600 MG tablet Take by mouth as needed     sildenafil (REVATIO) 20 mg tablet as needed     No current facility-administered medications for this visit.    ALLERGIES: Venom-honey bee  PAST MEDICAL  HISTORY: Past Medical History:  Diagnosis Date   Asthma    Hypertension     PAST SURGICAL HISTORY: History reviewed. No pertinent surgical history.   FAMILY HISTORY: Family History  Problem Relation Age of Onset   Asthma Mother    COPD Father    Emphysema Father    Incontinence Sister    COPD Sister    Heart disease Brother      SOCIAL HISTORY: Social History   Socioeconomic History   Marital status: Married  Tobacco Use   Smoking status: Current Every Day Smoker   Smokeless tobacco: Never Used  Building services engineer Use: Never used  Substance and Sexual Activity   Alcohol use: Never   Drug use: Yes    Comment: marijuana    PHYSICAL EXAM: Vitals:   09/20/20 1105  BP: 124/73  Pulse: 62   Body mass index is 22.32 kg/m. Weight: 72.6 kg (160 lb)   GENERAL: Alert, active, oriented x3  HEENT: Pupils equal reactive to light. Extraocular movements are intact. Sclera clear. Palpebral conjunctiva normal red color.Pharynx clear.  NECK: Supple with no palpable mass and no adenopathy.  LUNGS: Sound clear with no rales rhonchi or wheezes.  HEART: Regular rhythm S1 and S2 without murmur.  ABDOMEN: Soft and depressible, nontender with no palpable mass, no hepatomegaly.  Bilateral inguinal hernia were appreciated on the physical exam.  Right bigger than left.  Both are reducible.  EXTREMITIES: Well-developed well-nourished symmetrical with no dependent edema.  NEUROLOGICAL: Awake alert oriented, facial expression symmetrical, moving all extremities.  REVIEW OF DATA:  I have reviewed the following data today: No visits with results within 3 Month(s) from this visit.  Latest known visit with results is:  No results found for any previous visit.  I personally evaluated the labs done at last ED visit.  CBC and CMP within normal limits.  ASSESSMENT: Mr. Bontempo is a 65 y.o. male presenting for consultation for bilateral inguinal hernia.    The patient presents with a  symptomatic, reducible bilateral inguinal hernia. Patient was oriented about the diagnosis of inguinal hernia and its implication. The patient was oriented about the treatment alternatives (observation vs surgical repair). Due to patient symptoms, repair is recommended. Patient oriented about the surgical procedure, the use of mesh and its risk of complications such as: infection, bleeding, injury to vas deference, vasculature and testicle, injury to bowel or bladder, and chronic pain.   Non-recurrent bilateral inguinal hernia without obstruction or gangrene [K40.20]  PLAN: Robotic assisted laparoscopic bilateral inguinal hernia repair with mesh (41962) CBC, CMP (done 09/16/20) Avoid taking aspirin 5 days before procedure Contact us if has any question or concern.   Patient verbalized understanding, all questions were answered, and were agreeable with the plan outlined above.    Carolan Shiver, MD  Electronically signed by Carolan Shiver, MD

## 2020-09-22 ENCOUNTER — Encounter
Admission: RE | Admit: 2020-09-22 | Discharge: 2020-09-22 | Disposition: A | Discharge status: Home / Self Care | Payer: Medicaid Other | Source: Ambulatory Visit | Attending: General Surgery | Admitting: General Surgery

## 2020-09-22 ENCOUNTER — Other Ambulatory Visit: Payer: Self-pay

## 2020-09-22 HISTORY — DX: Dyspnea, unspecified: R06.00

## 2020-09-22 NOTE — Patient Instructions (Addendum)
Your procedure is scheduled on: 09/28/2020 Report to the Registration Desk on the 1st floor of the [facility]. To find out your arrival time, please call [phone] between 1PM - 3PM on: 09/27/2020  REMEMBER: Instructions that are not followed completely may result in serious medical risk, up to and including death; or upon the discretion of your surgeon and anesthesiologist your surgery may need to be rescheduled.  Do not eat food after midnight the night before surgery.  No gum chewing, lozengers or hard candies.  You may however, drink CLEAR liquids up to 2 hours before you are scheduled to arrive for your surgery. Do not drink anything within 2 hours of your scheduled arrival time.  Clear liquids include: - water  - apple juice without pulp - gatorade - black coffee or tea (Do NOT add milk or creamers to the coffee or tea) Do NOT drink anything that is not on this list.  TAKE THESE MEDICATIONS THE MORNING OF SURGERY WITH A SIP OF WATER: Albuterol Inhaler - bring to hospital Amlodipine  One week prior to surgery: Stop Anti-inflammatories (NSAIDS) such as Advil, Aleve, Ibuprofen, Motrin, Naproxen, Naprosyn and Aspirin based products such as Excedrin, Goodys Powder, BC Powder. You may however, continue to take Tylenol if needed for pain up until the day of surgery. Stop ANY OVER THE COUNTER supplements until after surgery.   No Alcohol for 24 hours before or after surgery.  No Smoking including e-cigarettes for 24 hours prior to surgery.  No chewable tobacco products for at least 6 hours prior to surgery.  No nicotine patches on the day of surgery.  Do not use any "recreational" drugs for at least a week prior to your surgery.  Please be advised that the combination of cocaine and anesthesia may have negative outcomes, up to and including death. If you test positive for cocaine, your surgery will be cancelled.  On the morning of surgery brush your teeth with toothpaste  and water, you may rinse your mouth with mouthwash if you wish. Do not swallow any toothpaste or mouthwash.  Do not wear lotions, powders, or perfumes.   Do not shave body from the neck down 48 hours prior to surgery just in case you cut yourself which could leave a site for infection.  Also, freshly shaved skin may become irritated if using the CHG soap.  Contact lenses, hearing aids and dentures may not be worn into surgery.  Do not bring valuables to the hospital. [facility] is not responsible for any missing/lost belongings or valuables.   Notify your doctor if there is any change in your medical condition (cold, fever, infection).  Wear comfortable clothing (specific to your surgery type) to the hospital.  If you are being admitted to the hospital overnight, leave your suitcase in the car. After surgery it may be brought to your room.  If you are being discharged the day of surgery, you will not be allowed to drive home. You will need a responsible adult (18 years or older) to drive you home and stay with you that night.   If you are taking public transportation, you will need to have a responsible adult (18 years or older) with you. Please confirm with your physician that it is acceptable to use public transportation.   Please call the Pre-admissions Testing Dept. at [phone] if you have any questions about these instructions.  Surgery Visitation Policy:  Patients undergoing a surgery or procedure may have one family member  or support person with them as long as that person is not COVID-19 positive or experiencing its symptoms.  That person may remain in the waiting area during the procedure.  Inpatient Visitation:    Visiting hours are 7 a.m. to 8 p.m. Inpatients will be allowed two visitors daily. The visitors may change each day during the patient's stay. No visitors under the age of 61. Any visitor under the age of 93 must be accompanied by an adult. The visitor  must pass COVID-19 screenings, use hand sanitizer when entering and exiting the patient's room and wear a mask at all times, including in the patient's room. Patients must also wear a mask when staff or their visitor are in the room. Masking is required regardless of vaccination status.

## 2020-09-28 ENCOUNTER — Ambulatory Visit: Payer: 59 | Admitting: Anesthesiology

## 2020-09-28 ENCOUNTER — Ambulatory Visit
Admission: RE | Admit: 2020-09-28 | Discharge: 2020-09-28 | Disposition: A | Discharge status: Home / Self Care | Payer: 59 | Attending: General Surgery | Admitting: General Surgery

## 2020-09-28 ENCOUNTER — Other Ambulatory Visit: Payer: Self-pay

## 2020-09-28 ENCOUNTER — Encounter
Admission: RE | Disposition: A | Discharge status: Home / Self Care | Payer: Self-pay | Source: Home / Self Care | Attending: General Surgery

## 2020-09-28 ENCOUNTER — Encounter: Payer: Self-pay | Admitting: General Surgery

## 2020-09-28 DIAGNOSIS — Z79899 Other long term (current) drug therapy: Secondary | ICD-10-CM | POA: Insufficient documentation

## 2020-09-28 DIAGNOSIS — I10 Essential (primary) hypertension: Secondary | ICD-10-CM | POA: Insufficient documentation

## 2020-09-28 DIAGNOSIS — F17200 Nicotine dependence, unspecified, uncomplicated: Secondary | ICD-10-CM | POA: Diagnosis not present

## 2020-09-28 DIAGNOSIS — K4020 Bilateral inguinal hernia, without obstruction or gangrene, not specified as recurrent: Secondary | ICD-10-CM | POA: Diagnosis not present

## 2020-09-28 HISTORY — PX: XI ROBOTIC ASSISTED INGUINAL HERNIA REPAIR WITH MESH: SHX6706

## 2020-09-28 LAB — URINE DRUG SCREEN, QUALITATIVE (ARMC ONLY)
Amphetamines, Ur Screen: NOT DETECTED
Barbiturates, Ur Screen: NOT DETECTED
Benzodiazepine, Ur Scrn: NOT DETECTED
Cannabinoid 50 Ng, Ur ~~LOC~~: POSITIVE — AB
Cocaine Metabolite,Ur ~~LOC~~: NOT DETECTED
MDMA (Ecstasy)Ur Screen: NOT DETECTED
Methadone Scn, Ur: NOT DETECTED
Opiate, Ur Screen: NOT DETECTED
Phencyclidine (PCP) Ur S: NOT DETECTED
Tricyclic, Ur Screen: NOT DETECTED

## 2020-09-28 SURGERY — REPAIR, HERNIA, INGUINAL, ROBOT-ASSISTED, LAPAROSCOPIC, USING MESH
Anesthesia: General | Site: Groin | Laterality: Bilateral

## 2020-09-28 MED ORDER — HYDROCODONE-ACETAMINOPHEN 5-325 MG PO TABS: 1.0000 | ORAL_TABLET | ORAL | 0 refills | Status: AC | PRN

## 2020-09-28 MED ORDER — MIDAZOLAM HCL 2 MG/2ML IJ SOLN
INTRAMUSCULAR | Status: DC | PRN
  Administered 2020-09-28: 2 mg via INTRAVENOUS

## 2020-09-28 MED ORDER — FAMOTIDINE 20 MG PO TABS
ORAL_TABLET | ORAL | Status: AC
  Filled 2020-09-28: qty 1

## 2020-09-28 MED ORDER — FENTANYL CITRATE (PF) 100 MCG/2ML IJ SOLN
INTRAMUSCULAR | Status: DC | PRN
  Administered 2020-09-28 (×2): 50 ug via INTRAVENOUS

## 2020-09-28 MED ORDER — CEFAZOLIN SODIUM-DEXTROSE 2-4 GM/100ML-% IV SOLN
INTRAVENOUS | Status: AC
  Filled 2020-09-28: qty 100

## 2020-09-28 MED ORDER — PROPOFOL 10 MG/ML IV BOLUS
INTRAVENOUS | Status: DC | PRN
  Administered 2020-09-28: 150 mg via INTRAVENOUS

## 2020-09-28 MED ORDER — MIDAZOLAM HCL 2 MG/2ML IJ SOLN
INTRAMUSCULAR | Status: AC
  Filled 2020-09-28: qty 2

## 2020-09-28 MED ORDER — BUPIVACAINE-EPINEPHRINE (PF) 0.25% -1:200000 IJ SOLN
INTRAMUSCULAR | Status: AC
  Filled 2020-09-28: qty 30

## 2020-09-28 MED ORDER — PROMETHAZINE HCL 25 MG/ML IJ SOLN: 6.2500 mg | INTRAMUSCULAR | Status: DC | PRN

## 2020-09-28 MED ORDER — CHLORHEXIDINE GLUCONATE 0.12 % MT SOLN
OROMUCOSAL | Status: AC
  Administered 2020-09-28: 15 mL via OROMUCOSAL
  Filled 2020-09-28: qty 15

## 2020-09-28 MED ORDER — FENTANYL CITRATE (PF) 100 MCG/2ML IJ SOLN
INTRAMUSCULAR | Status: AC
  Filled 2020-09-28: qty 2

## 2020-09-28 MED ORDER — PROPOFOL 10 MG/ML IV BOLUS
INTRAVENOUS | Status: AC
  Filled 2020-09-28: qty 20

## 2020-09-28 MED ORDER — OXYCODONE HCL 5 MG PO TABS
ORAL_TABLET | ORAL | Status: AC
  Filled 2020-09-28: qty 1

## 2020-09-28 MED ORDER — FENTANYL CITRATE (PF) 100 MCG/2ML IJ SOLN
INTRAMUSCULAR | Status: AC
  Administered 2020-09-28: 50 ug via INTRAVENOUS
  Filled 2020-09-28: qty 2

## 2020-09-28 MED ORDER — METOPROLOL TARTRATE 5 MG/5ML IV SOLN
INTRAVENOUS | Status: DC | PRN
  Administered 2020-09-28: 2 mg via INTRAVENOUS

## 2020-09-28 MED ORDER — OXYCODONE HCL 5 MG PO TABS
5.0000 mg | ORAL_TABLET | Freq: Once | ORAL | Status: AC | PRN
  Administered 2020-09-28: 5 mg via ORAL

## 2020-09-28 MED ORDER — PHENYLEPHRINE HCL (PRESSORS) 10 MG/ML IV SOLN
INTRAVENOUS | Status: AC
  Filled 2020-09-28: qty 1

## 2020-09-28 MED ORDER — SUGAMMADEX SODIUM 200 MG/2ML IV SOLN
INTRAVENOUS | Status: DC | PRN
  Administered 2020-09-28: 200 mg via INTRAVENOUS

## 2020-09-28 MED ORDER — ONDANSETRON HCL 4 MG/2ML IJ SOLN
INTRAMUSCULAR | Status: DC | PRN
  Administered 2020-09-28: 4 mg via INTRAVENOUS

## 2020-09-28 MED ORDER — OXYCODONE HCL 5 MG/5ML PO SOLN: 5.0000 mg | Freq: Once | ORAL | Status: AC | PRN

## 2020-09-28 MED ORDER — LACTATED RINGERS IV SOLN: INTRAVENOUS | Status: DC

## 2020-09-28 MED ORDER — MEPERIDINE HCL 25 MG/ML IJ SOLN: 6.2500 mg | INTRAMUSCULAR | Status: DC | PRN

## 2020-09-28 MED ORDER — CHLORHEXIDINE GLUCONATE 0.12 % MT SOLN: 15.0000 mL | Freq: Once | OROMUCOSAL | Status: AC

## 2020-09-28 MED ORDER — ROCURONIUM BROMIDE 100 MG/10ML IV SOLN
INTRAVENOUS | Status: DC | PRN
  Administered 2020-09-28: 50 mg via INTRAVENOUS
  Administered 2020-09-28: 20 mg via INTRAVENOUS

## 2020-09-28 MED ORDER — CEFAZOLIN SODIUM-DEXTROSE 2-4 GM/100ML-% IV SOLN
2.0000 g | INTRAVENOUS | Status: AC
  Administered 2020-09-28: 2 g via INTRAVENOUS

## 2020-09-28 MED ORDER — BUPIVACAINE-EPINEPHRINE 0.25% -1:200000 IJ SOLN
INTRAMUSCULAR | Status: DC | PRN
  Administered 2020-09-28: 30 mL

## 2020-09-28 MED ORDER — FENTANYL CITRATE (PF) 100 MCG/2ML IJ SOLN
25.0000 ug | INTRAMUSCULAR | Status: DC | PRN
  Administered 2020-09-28 (×2): 25 ug via INTRAVENOUS

## 2020-09-28 MED ORDER — DEXAMETHASONE SODIUM PHOSPHATE 10 MG/ML IJ SOLN
INTRAMUSCULAR | Status: DC | PRN
  Administered 2020-09-28: 10 mg via INTRAVENOUS

## 2020-09-28 MED ORDER — FAMOTIDINE 20 MG PO TABS
20.0000 mg | ORAL_TABLET | Freq: Once | ORAL | Status: AC
  Administered 2020-09-28: 20 mg via ORAL

## 2020-09-28 MED ORDER — LACTATED RINGERS IV SOLN: INTRAVENOUS | Status: DC | PRN

## 2020-09-28 MED ORDER — LIDOCAINE HCL (CARDIAC) PF 100 MG/5ML IV SOSY
PREFILLED_SYRINGE | INTRAVENOUS | Status: DC | PRN
  Administered 2020-09-28: 80 mg via INTRAVENOUS

## 2020-09-28 MED ORDER — HYDRALAZINE HCL 20 MG/ML IJ SOLN
INTRAMUSCULAR | Status: AC
  Filled 2020-09-28: qty 1

## 2020-09-28 MED ORDER — ORAL CARE MOUTH RINSE: 15.0000 mL | Freq: Once | OROMUCOSAL | Status: AC

## 2020-09-28 SURGICAL SUPPLY — 54 items
ADH SKN CLS APL DERMABOND .7 (GAUZE/BANDAGES/DRESSINGS) ×1
APL PRP STRL LF DISP 70% ISPRP (MISCELLANEOUS) ×1
BAG INFUSER PRESSURE 100CC (MISCELLANEOUS) IMPLANT
BLADE SURG SZ11 CARB STEEL (BLADE) ×2 IMPLANT
CANISTER SUCT 1200ML W/VALVE (MISCELLANEOUS) IMPLANT
CHLORAPREP W/TINT 26 (MISCELLANEOUS) ×2 IMPLANT
COVER TIP SHEARS 8 DVNC (MISCELLANEOUS) ×1 IMPLANT
COVER TIP SHEARS 8MM DA VINCI (MISCELLANEOUS) ×1
COVER WAND RF STERILE (DRAPES) ×2 IMPLANT
DEFOGGER SCOPE WARMER CLEARIFY (MISCELLANEOUS) ×2 IMPLANT
DERMABOND ADVANCED (GAUZE/BANDAGES/DRESSINGS) ×1
DERMABOND ADVANCED .7 DNX12 (GAUZE/BANDAGES/DRESSINGS) ×1 IMPLANT
DRAPE ARM DVNC X/XI (DISPOSABLE) ×3 IMPLANT
DRAPE COLUMN DVNC XI (DISPOSABLE) ×1 IMPLANT
DRAPE DA VINCI XI ARM (DISPOSABLE) ×3
DRAPE DA VINCI XI COLUMN (DISPOSABLE) ×1
ELECT REM PT RETURN 9FT ADLT (ELECTROSURGICAL) ×2
ELECTRODE REM PT RTRN 9FT ADLT (ELECTROSURGICAL) ×1 IMPLANT
GAUZE 4X4 16PLY ~~LOC~~+RFID DBL (SPONGE) ×2 IMPLANT
GLOVE SURG ENC MOIS LTX SZ6.5 (GLOVE) ×6 IMPLANT
GLOVE SURG UNDER POLY LF SZ6.5 (GLOVE) ×6 IMPLANT
GOWN STRL REUS W/ TWL LRG LVL3 (GOWN DISPOSABLE) ×3 IMPLANT
GOWN STRL REUS W/TWL LRG LVL3 (GOWN DISPOSABLE) ×6
IRRIGATOR SUCT 8 DISP DVNC XI (IRRIGATION / IRRIGATOR) IMPLANT
IRRIGATOR SUCTION 8MM XI DISP (IRRIGATION / IRRIGATOR)
IV CATH ANGIO 12GX3 LT BLUE (NEEDLE) ×2 IMPLANT
IV NS 1000ML (IV SOLUTION)
IV NS 1000ML BAXH (IV SOLUTION) IMPLANT
KIT PINK PAD W/HEAD ARE REST (MISCELLANEOUS) ×2
KIT PINK PAD W/HEAD ARM REST (MISCELLANEOUS) ×1 IMPLANT
LABEL OR SOLS (LABEL) IMPLANT
MANIFOLD NEPTUNE II (INSTRUMENTS) ×2 IMPLANT
MESH 3DMAX 4X6 LT LRG (Mesh General) ×1 IMPLANT
MESH 3DMAX 4X6 RT LRG (Mesh General) ×1 IMPLANT
MESH 3DMAX MID 4X6 LT LRG (Mesh General) ×1 IMPLANT
MESH 3DMAX MID 4X6 RT LRG (Mesh General) ×1 IMPLANT
NEEDLE HYPO 22GX1.5 SAFETY (NEEDLE) ×2 IMPLANT
NEEDLE INSUFFLATION 14GA 120MM (NEEDLE) ×2 IMPLANT
OBTURATOR OPTICAL STANDARD 8MM (TROCAR) ×1
OBTURATOR OPTICAL STND 8 DVNC (TROCAR) ×1
OBTURATOR OPTICALSTD 8 DVNC (TROCAR) ×1 IMPLANT
PACK LAP CHOLECYSTECTOMY (MISCELLANEOUS) ×2 IMPLANT
SEAL CANN UNIV 5-8 DVNC XI (MISCELLANEOUS) ×3 IMPLANT
SEAL XI 5MM-8MM UNIVERSAL (MISCELLANEOUS) ×3
SET TUBE SMOKE EVAC HIGH FLOW (TUBING) ×2 IMPLANT
SOLUTION ELECTROLUBE (MISCELLANEOUS) ×2 IMPLANT
SUT MNCRL 4-0 (SUTURE) ×2
SUT MNCRL 4-0 27XMFL (SUTURE) ×1
SUT VIC AB 2-0 SH 27 (SUTURE) ×4
SUT VIC AB 2-0 SH 27XBRD (SUTURE) ×2 IMPLANT
SUT VLOC 90 S/L VL9 GS22 (SUTURE) ×2 IMPLANT
SUTURE MNCRL 4-0 27XMF (SUTURE) ×1 IMPLANT
TAPE TRANSPORE STRL 2 31045 (GAUZE/BANDAGES/DRESSINGS) IMPLANT
TRAY FOLEY MTR SLVR 16FR STAT (SET/KITS/TRAYS/PACK) IMPLANT

## 2020-09-28 NOTE — Interval H&P Note (Signed)
History and Physical Interval Note:  09/28/2020 7:42 AM  Michael Chaney  has presented today for surgery, with the diagnosis of K40.20 Non Recurrent bilateral inguinal hernia w/o obstruction or gangrene.  The various methods of treatment have been discussed with the patient and family. After consideration of risks, benefits and other options for treatment, the patient has consented to  Procedure(s): XI ROBOTIC ASSISTED INGUINAL HERNIA REPAIR WITH MESH (Bilateral) as a surgical intervention.  The patient's history has been reviewed, patient examined, no change in status, stable for surgery.  I have reviewed the patient's chart and labs.  Questions were answered to the patient's satisfaction.     Carolan Shiver

## 2020-09-28 NOTE — Transfer of Care (Signed)
Immediate Anesthesia Transfer of Care Note  Patient: Michael Chaney  Procedure(s) Performed: XI ROBOTIC ASSISTED INGUINAL HERNIA REPAIR WITH MESH (Bilateral: Groin)   Patient Location: PACU  Anesthesia Type:General  Level of Consciousness: awake, alert  and oriented  Airway & Oxygen Therapy: Patient Spontanous Breathing and Patient connected to face mask oxygen  Post-op Assessment: Report given to RN and Post -op Vital signs reviewed and stable  Post vital signs: Reviewed and stable  Last Vitals:  Vitals Value Taken Time  BP 162/92 09/28/20 1046  Temp    Pulse 72 09/28/20 1049  Resp 14 09/28/20 1049  SpO2 100 % 09/28/20 1049  Vitals shown include unvalidated device data.  Last Pain:  Vitals:   09/28/20 0729  TempSrc: Temporal  PainSc: 0-No pain         Complications: No notable events documented.

## 2020-09-28 NOTE — Anesthesia Procedure Notes (Signed)
Procedure Name: Intubation Date/Time: 09/28/2020 8:43 AM Performed by: Danelle Berry, CRNA Pre-anesthesia Checklist: Patient identified, Emergency Drugs available, Suction available and Patient being monitored Patient Re-evaluated:Patient Re-evaluated prior to induction Oxygen Delivery Method: Circle system utilized Preoxygenation: Pre-oxygenation with 100% oxygen Induction Type: IV induction Ventilation: Mask ventilation without difficulty Laryngoscope Size: McGraph and 3 Grade View: Grade I Tube type: Oral Tube size: 7.5 mm Number of attempts: 1 Airway Equipment and Method: Stylet and Oral airway Placement Confirmation: ETT inserted through vocal cords under direct vision, positive ETCO2 and breath sounds checked- equal and bilateral Secured at: 22 cm Tube secured with: Tape Dental Injury: Teeth and Oropharynx as per pre-operative assessment

## 2020-09-28 NOTE — Anesthesia Preprocedure Evaluation (Signed)
Anesthesia Evaluation  Patient identified by MRN, date of birth, ID band Patient awake    Reviewed: Allergy & Precautions, NPO status , Patient's Chart, lab work & pertinent test results  History of Anesthesia Complications Negative for: history of anesthetic complications  Airway Mallampati: II  TM Distance: >3 FB Neck ROM: Full    Dental  (+) Edentulous Lower, Edentulous Upper   Pulmonary neg sleep apnea, neg COPD, Current SmokerPatient did not abstain from smoking.,    breath sounds clear to auscultation- rhonchi (-) wheezing      Cardiovascular Exercise Tolerance: Good hypertension, Pt. on medications (-) CAD, (-) Past MI, (-) Cardiac Stents and (-) CABG  Rhythm:Regular Rate:Normal - Systolic murmurs and - Diastolic murmurs    Neuro/Psych neg Seizures negative neurological ROS  negative psych ROS   GI/Hepatic negative GI ROS, Neg liver ROS,   Endo/Other  negative endocrine ROSneg diabetes  Renal/GU negative Renal ROS     Musculoskeletal negative musculoskeletal ROS (+)   Abdominal (+) - obese,   Peds  Hematology negative hematology ROS (+)   Anesthesia Other Findings Past Medical History: No date: Dyspnea No date: Hyperlipidemia No date: Hypertension   Reproductive/Obstetrics                             Anesthesia Physical Anesthesia Plan  ASA: 2  Anesthesia Plan: General   Post-op Pain Management:    Induction: Intravenous  PONV Risk Score and Plan: 0 and Ondansetron  Airway Management Planned: Oral ETT  Additional Equipment:   Intra-op Plan:   Post-operative Plan: Extubation in OR  Informed Consent: I have reviewed the patients History and Physical, chart, labs and discussed the procedure including the risks, benefits and alternatives for the proposed anesthesia with the patient or authorized representative who has indicated his/her understanding and acceptance.      Dental advisory given  Plan Discussed with: CRNA and Anesthesiologist  Anesthesia Plan Comments:         Anesthesia Quick Evaluation

## 2020-09-28 NOTE — Anesthesia Postprocedure Evaluation (Signed)
Anesthesia Post Note  Patient: Michael Chaney  Procedure(s) Performed: XI ROBOTIC ASSISTED INGUINAL HERNIA REPAIR WITH MESH (Bilateral: Groin)  Patient location during evaluation: PACU Anesthesia Type: General Level of consciousness: awake and alert and oriented Pain management: pain level controlled Vital Signs Assessment: post-procedure vital signs reviewed and stable Respiratory status: spontaneous breathing, nonlabored ventilation and respiratory function stable Cardiovascular status: blood pressure returned to baseline and stable Postop Assessment: no signs of nausea or vomiting Anesthetic complications: no   No notable events documented.   Last Vitals:  Vitals:   09/28/20 1152 09/28/20 1200  BP:    Pulse: (!) 50 72  Resp: 11 13  Temp:    SpO2: 95% 98%    Last Pain:  Vitals:   09/28/20 1200  TempSrc:   PainSc: 6                  Monterius Rolf

## 2020-09-28 NOTE — Discharge Instructions (Addendum)
  Diet: Resume home heart healthy regular diet.   Activity: No heavy lifting >20 pounds (children, pets, laundry, garbage) or strenuous activity until follow-up, but light activity and walking are encouraged. Do not drive or drink alcohol if taking narcotic pain medications.  Wound care: May shower with soapy water and pat dry (do not rub incisions), but no baths or submerging incision underwater until follow-up. (no swimming)   Medications: Resume all home medications. For mild to moderate pain: acetaminophen (Tylenol) ***or ibuprofen (if no kidney disease). Combining Tylenol with alcohol can substantially increase your risk of causing liver disease. Narcotic pain medications, if prescribed, can be used for severe pain, though may cause nausea, constipation, and drowsiness. Do not combine Tylenol and Norco within a 6 hour period as Norco contains Tylenol. If you do not need the narcotic pain medication, you do not need to fill the prescription.  Call office ([phone]) at any time if any questions, worsening pain, fevers/chills, bleeding, drainage from incision site, or other concerns.   AMBULATORY SURGERY  DISCHARGE INSTRUCTIONS   The drugs that you were given will stay in your system until tomorrow so for the next 24 hours you should not:  Drive an automobile Make any legal decisions Drink any alcoholic beverage   You may resume regular meals tomorrow.  Today it is better to start with liquids and gradually work up to solid foods.  You may eat anything you prefer, but it is better to start with liquids, then soup and crackers, and gradually work up to solid foods.   Please notify your doctor immediately if you have any unusual bleeding, trouble breathing, redness and pain at the surgery site, drainage, fever, or pain not relieved by medication.    Additional Instructions:        Please contact your physician with any problems or Same Day Surgery at [phone], Monday  through Friday 6 am to 4 pm, or [facility] Main number at [phone].  

## 2020-09-28 NOTE — Op Note (Signed)
Preoperative diagnosis: Bilateral inguinal hernia.   Postoperative diagnosis: Bilateral inguinal hernia.  Procedure: Robotic assisted Laparoscopic Transabdominal preperitoneal laparoscopic (TAPP) repair of bilateral inguinal hernia.  Anesthesia: GETA  Surgeon: Dr. Hazle Quant  Wound Classification: Clean  Indications:  Patient is a 65 y.o. male developed a symptomatic bilateral inguinal hernia. Repair was indicated.  Findings: 1.  Bilateral direct inguinal hernia identified 2. Vas deferens and cord structures identified and preserved 3. Bard 3D Max large mesh used for repair 4. Adequate hemostasis.   Description of procedure: The patient was taken to the operating room and the correct side of surgery was verified. The patient was placed supine with arms tucked at the sides. After obtaining adequate anesthesia, the patient's abdomen was prepped and draped in standard sterile fashion. The patient was placed in the Trendelenburg position. A time-out was completed verifying correct patient, procedure, site, positioning, and implant(s) and/or special equipment prior to beginning this procedure. A Veress needle was placed at the umbilicus and pneumoperitoneum created with insufflation of carbon dioxide to 15 mmHg. After the Veress needle was removed, an 8-mm trocar was placed on epigastric area and the 30 angled laparoscope inserted. Two 8-mm trocars were then placed lateral to the rectus sheath under direct visualization. Both inguinal regions were inspected and the median umbilical ligament, medial umbilical ligament, and lateral umbilical fold were identified.  The robotic arms were docked. The robotic scope was inserted and the pelvic area anatomy targeted.  Right inguinal hernia repair first since it was more symptomatic.  The peritoneum was incised with scissors along a line 5 cm above the superior edge of the hernia defect, extending from the median umbilical ligament to the anterior superior  iliac spine. The peritoneal flap was mobilized inferiorly using blunt and sharp dissection. The inferior epigastric vessels were exposed and the pubic symphysis was identified. Cooper's ligament was dissected to its junction with the iliac vein. The dissection was continued inferiorly to the iliopubic tract, with care taken to avoid injury to the femoral branch of the genitofemoral nerve and the lateral femoral cutaneous nerve. The cord structures were parietalized. The hernia was identified and reduced by gentle traction.  The hernia sac was noted mobilized from the cord structures and reduced into the peritoneal cavity.  A large piece of mesh was rolled longitudinally into a compact cylinder and passed through a trocar. The cylinder was placed along the inferior aspect of the working space and unrolled into place to completely cover the direct, indirect, and femoral spaces. The mesh was secured into place superiorly to the anterior abdominal wall and inferiorly and medially to Cooper's ligament with absorbable sutures. Care was taken to avoid the inferolateral triangles containing the iliac vessels and genital nerves. The peritoneal flap was closed over the mesh and secured with suture in similar positions of safety.  The left inguinal hernia was repair using the same technique. After ensuring adequate hemostasis, the trocars were removed and the pneumoperitoneum allowed to escape. The trocar incisions were closed using monocryl and skin adhesive dressings applied.  The patient tolerated the procedure well and was taken to the postanesthesia care unit in stable condition.   Specimen: None  Complications: None  Estimated Blood Loss: 5 mL

## 2020-09-28 NOTE — Progress Notes (Signed)
Called Dr. Priscella Mann and asked her if Michael Chaney's BP at 167/84 was okay and she stated that she was fine with that. He will probably drop lower when he leaves the hospital. I told patient to check his BP periodically today and let the doctor know if it gets too high. He understood and stated he would comply.

## 2020-10-27 DIAGNOSIS — J209 Acute bronchitis, unspecified: Secondary | ICD-10-CM | POA: Diagnosis not present

## 2020-10-27 DIAGNOSIS — F17218 Nicotine dependence, cigarettes, with other nicotine-induced disorders: Secondary | ICD-10-CM | POA: Diagnosis not present

## 2020-10-27 DIAGNOSIS — J45901 Unspecified asthma with (acute) exacerbation: Secondary | ICD-10-CM | POA: Diagnosis not present

## 2020-10-31 ENCOUNTER — Other Ambulatory Visit: Payer: Self-pay | Admitting: Infectious Diseases

## 2020-10-31 ENCOUNTER — Other Ambulatory Visit (HOSPITAL_COMMUNITY): Payer: Self-pay | Admitting: Infectious Diseases

## 2020-10-31 DIAGNOSIS — R911 Solitary pulmonary nodule: Secondary | ICD-10-CM

## 2020-11-15 LAB — COLOGUARD: Cologuard: NEGATIVE

## 2020-11-16 DIAGNOSIS — D72825 Bandemia: Secondary | ICD-10-CM | POA: Diagnosis not present

## 2020-11-17 DIAGNOSIS — N529 Male erectile dysfunction, unspecified: Secondary | ICD-10-CM | POA: Diagnosis not present

## 2020-11-17 DIAGNOSIS — I10 Essential (primary) hypertension: Secondary | ICD-10-CM | POA: Diagnosis not present

## 2020-11-18 ENCOUNTER — Ambulatory Visit
Admission: RE | Admit: 2020-11-18 | Discharge: 2020-11-18 | Disposition: A | Discharge status: Home / Self Care | Payer: 59 | Source: Ambulatory Visit | Attending: Infectious Diseases | Admitting: Infectious Diseases

## 2020-11-18 ENCOUNTER — Other Ambulatory Visit: Payer: Self-pay

## 2020-11-18 ENCOUNTER — Ambulatory Visit: Payer: 59

## 2020-11-18 DIAGNOSIS — R911 Solitary pulmonary nodule: Secondary | ICD-10-CM | POA: Insufficient documentation

## 2020-11-22 LAB — COLOGUARD: COLOGUARD: NEGATIVE

## 2021-01-31 ENCOUNTER — Other Ambulatory Visit: Payer: Self-pay | Admitting: Infectious Diseases

## 2021-01-31 DIAGNOSIS — R634 Abnormal weight loss: Secondary | ICD-10-CM

## 2021-01-31 DIAGNOSIS — R9389 Abnormal findings on diagnostic imaging of other specified body structures: Secondary | ICD-10-CM | POA: Diagnosis not present

## 2021-01-31 DIAGNOSIS — N529 Male erectile dysfunction, unspecified: Secondary | ICD-10-CM

## 2021-01-31 DIAGNOSIS — R7309 Other abnormal glucose: Secondary | ICD-10-CM | POA: Diagnosis not present

## 2021-01-31 DIAGNOSIS — I10 Essential (primary) hypertension: Secondary | ICD-10-CM | POA: Diagnosis not present

## 2021-02-03 ENCOUNTER — Ambulatory Visit (INDEPENDENT_AMBULATORY_CARE_PROVIDER_SITE_OTHER): Payer: 59 | Admitting: Urology

## 2021-02-03 ENCOUNTER — Encounter: Payer: Self-pay | Admitting: Urology

## 2021-02-03 ENCOUNTER — Other Ambulatory Visit: Payer: Self-pay

## 2021-02-03 VITALS — BP 130/70 | HR 78 | Ht 71.0 in | Wt 154.0 lb

## 2021-02-03 DIAGNOSIS — N5319 Other ejaculatory dysfunction: Secondary | ICD-10-CM

## 2021-02-03 DIAGNOSIS — N5201 Erectile dysfunction due to arterial insufficiency: Secondary | ICD-10-CM | POA: Diagnosis not present

## 2021-02-03 MED ORDER — TADALAFIL 20 MG PO TABS: ORAL_TABLET | ORAL | 0 refills | Status: DC

## 2021-02-03 NOTE — Progress Notes (Signed)
02/03/2021 10:00 AM   Michael Chaney 07/10/1955 [id number]  Referring provider: Mick Sell, MD [address] [place],  [place] [address]  Chief Complaint  Patient presents with   Erectile Dysfunction    HPI: Michael Chaney is a 65 y.o. male referred for evaluation of erectile dysfunction.  6 month history of difficulty achieving and maintaining an erection Also complains of difficulty achieving orgasm Some decreased libido, tiredness and fatigue Denies pain or curvature with erections Organic risk factors include hyperlipidemia, hypertension and antihypertensive medications 35 pack-year tobacco history and current smoker at 1 pack/day Trial sildenafil with improvement in erections   PMH: Past Medical History:  Diagnosis Date   Dyspnea    Hyperlipidemia    Hypertension     Surgical History: Past Surgical History:  Procedure Laterality Date   XI ROBOTIC ASSISTED INGUINAL HERNIA REPAIR WITH MESH Bilateral 09/28/2020   Procedure: XI ROBOTIC ASSISTED INGUINAL HERNIA REPAIR WITH MESH;  Surgeon: Carolan Shiver, MD;  Location: [facility] ORS;  Service: General;  Laterality: Bilateral;    Home Medications:  Allergies as of 02/03/2021       Reactions   Bee Venom Swelling        Medication List        Accurate as of February 03, 2021 10:00 AM. If you have any questions, ask your nurse or doctor.          STOP taking these medications    sildenafil 100 MG tablet Commonly known as: VIAGRA Stopped by: [name], MD       TAKE these medications    albuterol 108 (90 Base) MCG/ACT inhaler Commonly known as: VENTOLIN HFA INHALE 2 PUFFS INTO THE LUNGS EVERY 6 HOURS AS NEEDED FOR WHEEZING OR SHORT OF BREATH What changed:  how much to take how to take this when to take this reasons to take this   amLODipine 5 MG tablet Commonly known as: NORVASC TAKE ONE TABLET BY MOUTH EVERY DAY What changed:  how much to take when to take  this   B-12 PO Take 1 tablet by mouth daily.   ibuprofen 600 MG tablet Commonly known as: ADVIL Take 1 tablet (600 mg total) by mouth every 8 (eight) hours as needed for moderate pain.   rosuvastatin 5 MG tablet Commonly known as: CRESTOR TAKE ONE TABLET BY MOUTH EVERY DAY   sildenafil 20 MG tablet Commonly known as: REVATIO Take 2-5 tablets 1 hour prior to intercourse as needed.   trolamine salicylate 10 % cream Commonly known as: ASPERCREME Apply 1 application topically as needed for muscle pain.        Allergies:  Allergies  Allergen Reactions   Bee Venom Swelling    Family History: No family history on file.  Social History:  reports that he has been smoking cigarettes. He has a 35.25 pack-year smoking history. He has never used smokeless tobacco. He reports that he does not currently use alcohol. He reports current drug use. Drug: Marijuana.   Physical Exam: BP 130/70   Pulse 78   Ht 5\' 11"  (1.803 m)   Wt 154 lb (69.9 kg)   BMI 21.48 kg/m   Constitutional:  Alert and oriented, No acute distress. HEENT: [place] AT, moist mucus membranes.  Trachea midline, no masses. Cardiovascular: No clubbing, cyanosis, or edema. Respiratory: Normal respiratory effort, no increased work of breathing. GI: Abdomen is soft, nontender, nondistended, no abdominal masses GU: Phallus uncircumcised without lesions.  No corporal plaques.  Testes descended  bilaterally with slight atrophy estimated volume ~ 15 cc bilaterally Skin: No rashes, bruises or suspicious lesions. Neurologic: Grossly intact, no focal deficits, moving all 4 extremities. Psychiatric: Normal mood and affect.   Assessment & Plan:    1.  Erectile dysfunction Significant organic risk factors Some improvement on sildenafil and he desired a trial of tadalafil-Rx 20 mg tabs sent to pharmacy Noted some decreased libido and difficulty with ejaculation.  Slight testis atrophy on exam.  Testosterone and LH drawn  2.   Ejaculatory dysfunction As above   [name], MD  [facility] [address] [place], [place] [address] [phone]

## 2021-02-04 LAB — LUTEINIZING HORMONE: LH: 11.4 m[IU]/mL — ABNORMAL HIGH (ref 1.7–8.6)

## 2021-02-04 LAB — TESTOSTERONE: Testosterone: 241 ng/dL — ABNORMAL LOW (ref 264–916)

## 2021-02-07 ENCOUNTER — Telehealth: Payer: Self-pay | Admitting: *Deleted

## 2021-02-07 NOTE — Telephone Encounter (Signed)
-----   Message from [name], MD sent at 02/05/2021 10:26 AM EST ----- T level low at 241- needs a repeat AM T level and a prolactin

## 2021-02-09 NOTE — Progress Notes (Deleted)
   There were no vitals taken for this visit.   Subjective:    Patient ID: Michael Chaney, male    DOB: 1955-11-10, 65 y.o.   MRN: [id number]  HPI: Michael Chaney is a 65 y.o. male  No chief complaint on file.  Patient presents to clinic to establish care with new PCP.  Patient reports a history of ***. Patient denies a history of: Hypertension, Elevated Cholesterol, Diabetes, Thyroid problems, Depression, Anxiety, Neurological problems, and Abdominal problems.   Active Ambulatory Problems    Diagnosis Date Noted   Essential hypertension 03/02/2020   Smoking 03/02/2020   Encounter to establish care 03/02/2020   Elevated lipids 04/13/2020   Prediabetes 04/13/2020   Erectile dysfunction 04/28/2020   Hemorrhoid 04/28/2020   Resolved Ambulatory Problems    Diagnosis Date Noted   No Resolved Ambulatory Problems   Past Medical History:  Diagnosis Date   Dyspnea    Hyperlipidemia    Hypertension    Past Surgical History:  Procedure Laterality Date   XI ROBOTIC ASSISTED INGUINAL HERNIA REPAIR WITH MESH Bilateral 09/28/2020   Procedure: XI ROBOTIC ASSISTED INGUINAL HERNIA REPAIR WITH MESH;  Surgeon: Carolan Shiver, MD;  Location: [facility] ORS;  Service: General;  Laterality: Bilateral;   No family history on file.   Review of Systems  Per HPI unless specifically indicated above     Objective:    There were no vitals taken for this visit.  Wt Readings from Last 3 Encounters:  02/03/21 154 lb (69.9 kg)  09/28/20 160 lb 0.9 oz (72.6 kg)  09/16/20 160 lb (72.6 kg)    Physical Exam  Results for orders placed or performed in visit on 02/03/21  Testosterone  Result Value Ref Range   Testosterone 241 (L) 264 - 916 ng/dL  Luteinizing hormone  Result Value Ref Range   LH 11.4 (H) 1.7 - 8.6 mIU/mL      Assessment & Plan:   Problem List Items Addressed This Visit       Cardiovascular and Mediastinum   Essential hypertension - Primary     Other   Elevated  lipids   Prediabetes     Follow up plan: No follow-ups on file.

## 2021-02-10 ENCOUNTER — Ambulatory Visit (INDEPENDENT_AMBULATORY_CARE_PROVIDER_SITE_OTHER): Payer: Medicaid Other | Admitting: Nurse Practitioner

## 2021-02-10 ENCOUNTER — Other Ambulatory Visit: Payer: Self-pay

## 2021-02-10 ENCOUNTER — Telehealth: Payer: Self-pay | Admitting: *Deleted

## 2021-02-10 VITALS — Ht 68.5 in | Wt 160.8 lb

## 2021-02-10 DIAGNOSIS — R7303 Prediabetes: Secondary | ICD-10-CM

## 2021-02-10 DIAGNOSIS — I10 Essential (primary) hypertension: Secondary | ICD-10-CM

## 2021-02-10 DIAGNOSIS — E785 Hyperlipidemia, unspecified: Secondary | ICD-10-CM

## 2021-02-10 NOTE — Telephone Encounter (Signed)
Tried to call patient with results

## 2021-04-09 ENCOUNTER — Other Ambulatory Visit: Payer: Self-pay | Admitting: *Deleted

## 2021-04-09 DIAGNOSIS — F17210 Nicotine dependence, cigarettes, uncomplicated: Secondary | ICD-10-CM

## 2021-04-09 DIAGNOSIS — Z87891 Personal history of nicotine dependence: Secondary | ICD-10-CM

## 2021-06-15 ENCOUNTER — Encounter: Payer: Self-pay | Admitting: Emergency Medicine

## 2021-06-15 ENCOUNTER — Other Ambulatory Visit: Payer: Self-pay

## 2021-06-15 ENCOUNTER — Emergency Department
Admission: EM | Admit: 2021-06-15 | Discharge: 2021-06-15 | Disposition: A | Discharge status: Home / Self Care | Payer: Medicaid Other | Attending: Emergency Medicine | Admitting: Emergency Medicine

## 2021-06-15 DIAGNOSIS — R7303 Prediabetes: Secondary | ICD-10-CM | POA: Diagnosis not present

## 2021-06-15 DIAGNOSIS — M545 Low back pain, unspecified: Secondary | ICD-10-CM | POA: Diagnosis present

## 2021-06-15 DIAGNOSIS — M5459 Other low back pain: Secondary | ICD-10-CM | POA: Diagnosis not present

## 2021-06-15 DIAGNOSIS — I10 Essential (primary) hypertension: Secondary | ICD-10-CM | POA: Diagnosis not present

## 2021-06-15 MED ORDER — PREDNISONE 10 MG (21) PO TBPK: ORAL_TABLET | ORAL | 0 refills | Status: DC

## 2021-06-15 MED ORDER — LIDOCAINE 5 % EX PTCH: 1.0000 | MEDICATED_PATCH | Freq: Two times a day (BID) | CUTANEOUS | 0 refills | Status: DC

## 2021-06-15 NOTE — ED Provider Notes (Signed)
? ?[facility] ?Provider Note ? ? ? Event Date/Time  ? First MD Initiated Contact with Patient 06/15/21 1031   ?  (approximate) ? ? ?History  ? ?Hip Pain ? ? ?HPI ? ?Michael Chaney is a 66 y.o. male  who, per family practice note dated 02/10/21 has history of hypertension and prediabetes, presents to the emergency department because of concern for left lower back pain. Patient states hip, however when he points to the area of discomfort it is located in the left lower back, roughly overlying the left SI joint. Pain started two days ago. Denies any specific trauma recently but states he has had issues in his past that have caused back pain. The patient denies any pain radiating down his leg. Has tried tylenol without any significant relief. Says he was unable to go to work yesterday. ? ? ?Physical Exam  ? ?Triage Vital Signs: ?ED Triage Vitals  ?Enc Vitals Group  ?   BP 06/15/21 0956 115/82  ?   Pulse Rate 06/15/21 0956 70  ?   Resp 06/15/21 0956 20  ?   Temp 06/15/21 0956 98.8 ?F (37.1 ?C)  ?   Temp Source 06/15/21 0956 Oral  ?   SpO2 06/15/21 0956 100 %  ?   Weight 06/15/21 0951 157 lb (71.2 kg)  ?   Height 06/15/21 0951 5' 8.5" (1.74 m)  ?   Head Circumference --   ?   Peak Flow --   ?   Pain Score 06/15/21 0951 10  ? ?Most recent vital signs: ?Vitals:  ? 06/15/21 0956  ?BP: 115/82  ?Pulse: 70  ?Resp: 20  ?Temp: 98.8 ?F (37.1 ?C)  ?SpO2: 100%  ? ? ?General: Awake, no distress.  ?CV:  Good peripheral perfusion. Regular rate and rhythm, no murmurs rubs. ?Resp:  Normal effort. Lungs clear to ausculation. ?Abd:  No distention.  ?MSK:  No spinal tenderness. No tenderness over left si. ? ? ?ED Results / Procedures / Treatments  ? ?Labs ?(all labs ordered are listed, but only abnormal results are displayed) ?Labs Reviewed - No data to display ? ? ?EKG ? ?None ? ? ?RADIOLOGY ?None ? ? ?PROCEDURES: ? ?Critical Care performed: No ? ?Procedures ? ? ?MEDICATIONS ORDERED IN ED: ?Medications - No data to  display ? ? ?IMPRESSION / MDM / ASSESSMENT AND PLAN / ED COURSE  ?I reviewed the triage vital signs and the nursing notes. ?             ?               ? ?Differential diagnosis includes, but is not limited to, arthritis, disc disease, kidney stone, UTI, diverticulitis. ? ?Patient presents to the emergency department today because of concern for left lower back pain. Pain does roughly overly the SI joint. Patient does describe similar symptoms multiple times in the past, once that started at a job site and another time playing basketball. No spinal tenderness on exam. At this time I do not think patient suffering from cauda equina given lack of other concerning symptoms. Do think possibly arthritis (however patient does not think this could be it) vs disc disease likely. Given no acute trauma do not feel any emergent imaging is warranted. Will trial patient on steroids and lidoderm patch for symptomatic control. Additionally will give patient a couple of days off of work for rest. Encouraged patient to follow up with PCP. ? ? ?FINAL CLINICAL IMPRESSION(S) / ED DIAGNOSES  ? ?  Final diagnoses:  ?Acute left-sided low back pain without sciatica  ? ? ? ?Rx / DC Orders  ? ?ED Discharge Orders   ? ?      Ordered  ?  lidocaine (LIDODERM) 5 %  Every 12 hours       ? 06/15/21 1101  ?  predniSONE (STERAPRED UNI-PAK 21 TAB) 10 MG (21) TBPK tablet       ? 06/15/21 1101  ? ?  ?  ? ?  ? ? ? ?Note:  This document was prepared using Dragon voice recognition software and may include unintentional dictation errors. ? ?  ?Phineas Semen, MD ?06/15/21 1114 ? ?

## 2021-06-15 NOTE — Discharge Instructions (Signed)
Please seek medical attention for any high fevers, chest pain, shortness of breath, change in behavior, persistent vomiting, bloody stool or any other new or concerning symptoms.  

## 2021-06-15 NOTE — ED Notes (Signed)
See triage note  presents with pain to lower back and left hip area  states this episode started 2 days ago..  states he has had intermittnet pain to left hip s/p injury back in 1978 ?

## 2021-06-15 NOTE — ED Notes (Signed)
See triage note  presents with pain to left hip area and lower back  denies any falls  states he does lift heavy items   ?

## 2021-06-15 NOTE — ED Triage Notes (Signed)
Pt via POV from home. Pt c/o L hip pain states he does a lot of heavy lifting at work and states it has been bothering his since yesterday. Pt ambulatory to triage. Pt is A&OX4 and NAD.  ?

## 2021-11-08 ENCOUNTER — Emergency Department: Payer: Medicaid Other

## 2021-11-08 ENCOUNTER — Encounter: Payer: Self-pay | Admitting: Emergency Medicine

## 2021-11-08 ENCOUNTER — Other Ambulatory Visit: Payer: Self-pay

## 2021-11-08 DIAGNOSIS — Z79899 Other long term (current) drug therapy: Secondary | ICD-10-CM | POA: Diagnosis not present

## 2021-11-08 DIAGNOSIS — I10 Essential (primary) hypertension: Secondary | ICD-10-CM | POA: Diagnosis not present

## 2021-11-08 DIAGNOSIS — R0789 Other chest pain: Secondary | ICD-10-CM | POA: Insufficient documentation

## 2021-11-08 LAB — CBC
HCT: 41.8 % (ref 39.0–52.0)
Hemoglobin: 14.3 g/dL (ref 13.0–17.0)
MCH: 31.8 pg (ref 26.0–34.0)
MCHC: 34.2 g/dL (ref 30.0–36.0)
MCV: 92.9 fL (ref 80.0–100.0)
Platelets: 228 10*3/uL (ref 150–400)
RBC: 4.5 MIL/uL (ref 4.22–5.81)
RDW: 12.8 % (ref 11.5–15.5)
WBC: 11.5 10*3/uL — ABNORMAL HIGH (ref 4.0–10.5)
nRBC: 0 % (ref 0.0–0.2)

## 2021-11-08 LAB — BASIC METABOLIC PANEL
Anion gap: 7 (ref 5–15)
BUN: 20 mg/dL (ref 8–23)
CO2: 23 mmol/L (ref 22–32)
Calcium: 9.6 mg/dL (ref 8.9–10.3)
Chloride: 107 mmol/L (ref 98–111)
Creatinine, Ser: 0.84 mg/dL (ref 0.61–1.24)
GFR, Estimated: >60 mL/min (ref >60)
Glucose, Bld: 97 mg/dL (ref 70–99)
Potassium: 3.6 mmol/L (ref 3.5–5.1)
Sodium: 137 mmol/L (ref 135–145)

## 2021-11-08 LAB — TROPONIN I (HIGH SENSITIVITY)
Troponin I (High Sensitivity): 3 ng/L (ref <18)
Troponin I (High Sensitivity): 3 ng/L (ref <18)

## 2021-11-08 NOTE — ED Triage Notes (Signed)
Pt presents via pov with c/o chest pain that began today around lunch time. Pt reports chest pain started after eating chicken. Pt reports chest pain worse with inspiration. Pt reports drinking juice helped pain some, but pain came back. Pt denies shob outside of normal, n/V.

## 2021-11-09 ENCOUNTER — Emergency Department
Admission: EM | Admit: 2021-11-09 | Discharge: 2021-11-09 | Disposition: A | Discharge status: Home / Self Care | Payer: Medicaid Other | Attending: Emergency Medicine | Admitting: Emergency Medicine

## 2021-11-09 DIAGNOSIS — R0789 Other chest pain: Secondary | ICD-10-CM

## 2021-11-09 MED ORDER — LIDOCAINE VISCOUS HCL 2 % MT SOLN
15.0000 mL | Freq: Once | OROMUCOSAL | Status: AC
  Administered 2021-11-09: 15 mL via OROMUCOSAL
  Filled 2021-11-09: qty 15

## 2021-11-09 MED ORDER — ALUM & MAG HYDROXIDE-SIMETH 200-200-20 MG/5ML PO SUSP
30.0000 mL | Freq: Once | ORAL | Status: AC
  Administered 2021-11-09: 30 mL via ORAL
  Filled 2021-11-09: qty 30

## 2021-11-09 NOTE — ED Provider Notes (Signed)
[facility] Provider Note    Event Date/Time   First MD Initiated Contact with Patient 11/09/21 [phone]     (approximate)   History   Chest Pain   HPI  Michael Chaney is a 65 y.o. male with history of hypertension, hyperlipidemia who presents to the emergency department with central chest pain without radiation that started tonight after eating baked chicken and drinking milk.  He states he felt like he got something stuck in his chest.  Drink some juice and his symptoms resolved.  States he went to work tonight and symptoms came back.  Have improved but still states he feels "sore" in this area.  No shortness of breath, nausea, vomiting, diaphoresis or dizziness.   History provided by patient.    Past Medical History:  Diagnosis Date   Dyspnea    Hyperlipidemia    Hypertension     Past Surgical History:  Procedure Laterality Date   XI ROBOTIC ASSISTED INGUINAL HERNIA REPAIR WITH MESH Bilateral 09/28/2020   Procedure: XI ROBOTIC ASSISTED INGUINAL HERNIA REPAIR WITH MESH;  Surgeon: Carolan Shiver, MD;  Location: [facility] ORS;  Service: General;  Laterality: Bilateral;    MEDICATIONS:  Prior to Admission medications   Medication Sig Start Date End Date Taking? Authorizing Provider  amLODipine (NORVASC) 5 MG tablet TAKE ONE TABLET BY MOUTH EVERY DAY Patient taking differently: Take 5 mg by mouth in the morning. 06/23/20 06/23/21  Iloabachie, Chioma E, NP  Cyanocobalamin (B-12 PO) Take 1 tablet by mouth daily.    [provider]  ibuprofen (ADVIL) 600 MG tablet Take 1 tablet (600 mg total) by mouth every 8 (eight) hours as needed for moderate pain. 09/16/20   Shaune Pollack, MD  lidocaine (LIDODERM) 5 % Place 1 patch onto the skin every 12 (twelve) hours. Remove & Discard patch within 12 hours or as directed by MD 06/15/21 06/15/22  Phineas Semen, MD  predniSONE (STERAPRED UNI-PAK 21 TAB) 10 MG (21) TBPK tablet Per packaging instructions  06/15/21   Phineas Semen, MD  tadalafil (CIALIS) 20 MG tablet Take 1 tablet by mouth 1 hour prior to intercourse 02/03/21   Stoioff, Verna Czech, MD  trolamine salicylate (ASPERCREME) 10 % cream Apply 1 application topically as needed for muscle pain.    [provider]    Physical Exam   Triage Vital Signs: ED Triage Vitals  Enc Vitals Group     BP 11/08/21 1904 126/84     Pulse Rate 11/08/21 1904 84     Resp 11/08/21 1904 17     Temp 11/08/21 1904 98.2 F (36.8 C)     Temp Source 11/08/21 1904 Oral     SpO2 11/08/21 1904 95 %     Weight 11/08/21 1907 156 lb 15.5 oz (71.2 kg)     Height --      Head Circumference --      Peak Flow --      Pain Score 11/08/21 1906 5     Pain Loc --      Pain Edu? --      Excl. in GC? --     Most recent vital signs: Vitals:   11/08/21 2225 11/09/21 0203  BP: (!) 140/88 (!) 153/91  Pulse: 68 68  Resp: 16 18  Temp: 98.1 F (36.7 C) 98.1 F (36.7 C)  SpO2: 97% 96%    CONSTITUTIONAL: Alert and oriented and responds appropriately to questions. Well-appearing; well-nourished HEAD: Normocephalic, atraumatic EYES: Conjunctivae clear, pupils  appear equal, sclera nonicteric ENT: normal nose; moist mucous membranes, normal phonation, handling his secretions without difficulty NECK: Supple, normal ROM CARD: RRR; S1 and S2 appreciated; no murmurs, no clicks, no rubs, no gallops RESP: Normal chest excursion without splinting or tachypnea; breath sounds clear and equal bilaterally; no wheezes, no rhonchi, no rales, no hypoxia or respiratory distress, speaking full sentences ABD/GI: Normal bowel sounds; non-distended; soft, non-tender, no rebound, no guarding, no peritoneal signs BACK: The back appears normal EXT: Normal ROM in all joints; no deformity noted, no edema; no cyanosis, no calf tenderness or calf swelling SKIN: Normal color for age and race; warm; no rash on exposed skin NEURO: Moves all extremities equally, normal speech PSYCH:  The patient's mood and manner are appropriate.   ED Results / Procedures / Treatments   LABS: (all labs ordered are listed, but only abnormal results are displayed) Labs Reviewed  CBC - Abnormal; Notable for the following components:      Result Value   WBC 11.5 (*)    All other components within normal limits  BASIC METABOLIC PANEL  TROPONIN I (HIGH SENSITIVITY)  TROPONIN I (HIGH SENSITIVITY)     EKG:  EKG Interpretation  Date/Time:  Wednesday November 08 2021 19:02:02 EDT Ventricular Rate:  82 PR Interval:  142 QRS Duration: 80 QT Interval:  352 QTC Calculation: 411 R Axis:   59 Text Interpretation: Normal sinus rhythm Normal ECG When compared with ECG of 28-Sep-2020 07:36, Vent. rate has increased BY  30 BPM Confirmed by Rochele Raring [phone]) on 11/09/2021 2:14:39 AM         RADIOLOGY: My personal review and interpretation of imaging: Chest x-ray clear.  I have personally reviewed all radiology reports.   DG Chest 2 View  Result Date: 11/08/2021 CLINICAL DATA:  Chest pain EXAM: CHEST - 2 VIEW COMPARISON:  CT chest 03/29/2020 FINDINGS: Hyperinflation compatible with emphysema. No focal consolidation, pleural effusion, or pneumothorax. Normal cardiomediastinal silhouette. No acute osseous abnormality. IMPRESSION: No acute cardiopulmonary disease. Emphysema (ICD10-J43.9). Electronically Signed   By: Minerva Fester M.D.   On: 11/08/2021 19:49     PROCEDURES:  Critical Care performed: No    .1-3 Lead EKG Interpretation  Performed by: Shubh Chiara, Layla Maw, DO Authorized by: Rossy Virag, Layla Maw, DO     Interpretation: normal     ECG rate:  68   ECG rate assessment: normal     Rhythm: sinus rhythm     Ectopy: none     Conduction: normal       IMPRESSION / MDM / ASSESSMENT AND PLAN / ED COURSE  I reviewed the triage vital signs and the nursing notes.    Patient here with atypical chest pain that started after eating baked chicken and he felt like something got  stuck in his esophagus.  He is able to eat, drink, swallow his own secretions without difficulty.  The patient is on the cardiac monitor to evaluate for evidence of arrhythmia and/or significant heart rate changes.   DIFFERENTIAL DIAGNOSIS (includes but not limited to):   Esophageal foreign body, esophageal spasm, esophagitis, GERD, less likely ACS, PE, dissection   Patient's presentation is most consistent with acute presentation with potential threat to life or bodily function.   PLAN: CBC, BMP, troponin x2, chest x-ray obtained from triage.  Labs show normal hemoglobin, normal electrolytes and renal function, troponin x2 negative.  EKG nonischemic.  Chest x-ray reviewed and interpreted by myself and radiology and shows no acute abnormality.  No widened mediastinum or cardiomegaly.  Given GI cocktail here and patient reports feeling much better.  He is able to swallow without difficulty.  Symptoms seem atypical for ACS.  I feel he is safe for discharge home.  Suspect this was more likely GI in nature and have given him outpatient recommendations if this reoccurs.   MEDICATIONS GIVEN IN ED: Medications  alum & mag hydroxide-simeth (MAALOX/MYLANTA) 200-200-20 MG/5ML suspension 30 mL (30 mLs Oral Given 11/09/21 0235)  lidocaine (XYLOCAINE) 2 % viscous mouth solution 15 mL (15 mLs Mouth/Throat Given 11/09/21 0235)     ED COURSE:  At this time, I do not feel there is any life-threatening condition present. I reviewed all nursing notes, vitals, pertinent previous records.  All lab and urine results, EKGs, imaging ordered have been independently reviewed and interpreted by myself.  I reviewed all available radiology reports from any imaging ordered this visit.  Based on my assessment, I feel the patient is safe to be discharged home without further emergent workup and can continue workup as an outpatient as needed. Discussed all findings, treatment plan as well as usual and customary return  precautions.  They verbalize understanding and are comfortable with this plan.  Outpatient follow-up has been provided as needed.  All questions have been answered.    CONSULTS: No admission needed at this time for atypical chest pain with reassuring cardiac work-up in the ED.  Patient appropriate for outpatient management.  Currently asymptomatic.   OUTSIDE RECORDS REVIEWED: Reviewed patient's last office visit with Clydie Braun on 01/31/2021.       FINAL CLINICAL IMPRESSION(S) / ED DIAGNOSES   Final diagnoses:  Atypical chest pain     Rx / DC Orders   ED Discharge Orders     None        Note:  This document was prepared using Dragon voice recognition software and may include unintentional dictation errors.   Andersson Larrabee, Layla Maw, DO 11/09/21 [phone]

## 2021-11-09 NOTE — Discharge Instructions (Signed)
Please avoid NSAIDs such as aspirin (Goody powders), ibuprofen (Motrin, Advil), naproxen (Aleve) as these may worsen your symptoms.  Tylenol 1000 mg every 6 hours is safe to take as long as you have no history of liver problems (heavy alcohol use, cirrhosis, hepatitis).  Please avoid spicy, acidic (citrus fruits, tomato based sauces, salsa), greasy, fatty foods.  Please avoid caffeine and alcohol.  Smoking can also make GERD/acid reflux worse.  Over the counter medications such as TUMS, Maalox or Mylanta, pepcid, Prilosec or Nexium may help with your symptoms if they return.

## 2021-11-20 ENCOUNTER — Ambulatory Visit: Admission: RE | Admit: 2021-11-20 | Payer: Medicaid Other | Source: Ambulatory Visit

## 2021-12-22 ENCOUNTER — Telehealth: Payer: Self-pay | Admitting: *Deleted

## 2021-12-22 NOTE — Telephone Encounter (Signed)
Left message on voicemail to call and schedule follow up LCS CT scan. 

## 2022-02-13 ENCOUNTER — Encounter: Payer: Self-pay | Admitting: *Deleted

## 2022-02-26 ENCOUNTER — Telehealth: Payer: Self-pay | Admitting: *Deleted

## 2022-02-26 NOTE — Telephone Encounter (Signed)
Receive a voicemail from pt . Attempted to return call to pt but had to leave a voicemail for pt to call back to schedule yearly lung screening CT.

## 2022-03-05 ENCOUNTER — Ambulatory Visit: Payer: Self-pay

## 2022-03-05 DIAGNOSIS — I10 Essential (primary) hypertension: Secondary | ICD-10-CM

## 2022-03-05 DIAGNOSIS — N5201 Erectile dysfunction due to arterial insufficiency: Secondary | ICD-10-CM

## 2022-03-05 NOTE — Telephone Encounter (Signed)
Message from Michael Chaney sent at 03/05/2022  9:51 AM EST  Called pt and advised that he would either need to make appt at [facility] for med refill or go to a UC if does not want to make an appt at [facility].Appt made for 03/20/22.   Pt requesting refill of  Amlodipine and tadalafil and please send to [facility] on [address]. Last RF amlodipine: 06/23/20 and expired on 06/23/21. Last RF of Cialis 02/03/21 and expired 02/03/22. Called pt and advised these meds have expired and will need to be reordered. Advised nurses cannot reorder and will send back to nurse to review.   Summary: Seeking to est at [facility], out of BP medication.   Pt called [facility] seeking a new patient appt, he declined the next available since it is not until June. Says he needs help now, will not specify other than stating he needs to speak to a nurse.  Best contact: [phone]  Says he ran out of his blood pressure pills.         Reason for Disposition  [1] Prescription refill request for ESSENTIAL medicine (i.e., likelihood of harm to patient if not taken) AND [2] triager unable to refill per department policy  Answer Assessment - Initial Assessment Questions 1. DRUG NAME: "What medicine do you need to have refilled?"     Amlodipine and Cialis- ran out of BP med 2 days ago. 2. REFILLS REMAINING: "How many refills are remaining?" (Note: The label on the medicine or pill bottle will show how many refills are remaining. If there are no refills remaining, then a renewal may be needed.)     N/a 3. EXPIRATION DATE: "What is the expiration date?" (Note: The label states when the prescription will expire, and thus can no longer be refilled.)     N/a 4. PRESCRIBING HCP: "Who prescribed it?" Reason: If prescribed by specialist, call should be referred to that group.     Dr. Virl Diamond previously ordered Cialis  and Amlodipine was ordered by  Pershing Cox NP  5. SYMPTOMS: "Do you have any symptoms?"     no 6.  PREGNANCY: "Is there any chance that you are pregnant?" "When was your last menstrual period?"     N/a  Protocols used: Medication Refill and Renewal Call-A-AH

## 2022-03-08 ENCOUNTER — Emergency Department
Admission: EM | Admit: 2022-03-08 | Discharge: 2022-03-08 | Discharge status: Against Medical Advice | Payer: Medicaid Other

## 2022-03-08 ENCOUNTER — Emergency Department: Payer: Medicaid Other

## 2022-03-08 ENCOUNTER — Other Ambulatory Visit: Payer: Self-pay

## 2022-03-08 DIAGNOSIS — R6520 Severe sepsis without septic shock: Secondary | ICD-10-CM | POA: Diagnosis present

## 2022-03-08 DIAGNOSIS — E785 Hyperlipidemia, unspecified: Secondary | ICD-10-CM | POA: Diagnosis present

## 2022-03-08 DIAGNOSIS — F17210 Nicotine dependence, cigarettes, uncomplicated: Secondary | ICD-10-CM | POA: Diagnosis present

## 2022-03-08 DIAGNOSIS — I2510 Atherosclerotic heart disease of native coronary artery without angina pectoris: Secondary | ICD-10-CM | POA: Diagnosis present

## 2022-03-08 DIAGNOSIS — Z91138 Patient's unintentional underdosing of medication regimen for other reason: Secondary | ICD-10-CM

## 2022-03-08 DIAGNOSIS — T461X6A Underdosing of calcium-channel blockers, initial encounter: Secondary | ICD-10-CM | POA: Diagnosis present

## 2022-03-08 DIAGNOSIS — J439 Emphysema, unspecified: Secondary | ICD-10-CM | POA: Diagnosis present

## 2022-03-08 DIAGNOSIS — R911 Solitary pulmonary nodule: Secondary | ICD-10-CM | POA: Diagnosis present

## 2022-03-08 DIAGNOSIS — J101 Influenza due to other identified influenza virus with other respiratory manifestations: Secondary | ICD-10-CM | POA: Diagnosis present

## 2022-03-08 DIAGNOSIS — J45901 Unspecified asthma with (acute) exacerbation: Secondary | ICD-10-CM | POA: Diagnosis present

## 2022-03-08 DIAGNOSIS — Z1152 Encounter for screening for COVID-19: Secondary | ICD-10-CM

## 2022-03-08 DIAGNOSIS — J9601 Acute respiratory failure with hypoxia: Secondary | ICD-10-CM | POA: Diagnosis present

## 2022-03-08 DIAGNOSIS — Z79899 Other long term (current) drug therapy: Secondary | ICD-10-CM

## 2022-03-08 DIAGNOSIS — I10 Essential (primary) hypertension: Secondary | ICD-10-CM | POA: Diagnosis present

## 2022-03-08 DIAGNOSIS — T486X6A Underdosing of antiasthmatics, initial encounter: Secondary | ICD-10-CM | POA: Diagnosis present

## 2022-03-08 DIAGNOSIS — E876 Hypokalemia: Secondary | ICD-10-CM | POA: Diagnosis present

## 2022-03-08 DIAGNOSIS — A4189 Other specified sepsis: Principal | ICD-10-CM | POA: Diagnosis present

## 2022-03-08 LAB — BASIC METABOLIC PANEL
Anion gap: 12 (ref 5–15)
BUN: 15 mg/dL (ref 8–23)
CO2: 20 mmol/L — ABNORMAL LOW (ref 22–32)
Calcium: 9.4 mg/dL (ref 8.9–10.3)
Chloride: 105 mmol/L (ref 98–111)
Creatinine, Ser: 0.86 mg/dL (ref 0.61–1.24)
GFR, Estimated: >60 mL/min (ref >60)
Glucose, Bld: 142 mg/dL — ABNORMAL HIGH (ref 70–99)
Potassium: 3.3 mmol/L — ABNORMAL LOW (ref 3.5–5.1)
Sodium: 137 mmol/L (ref 135–145)

## 2022-03-08 LAB — CBC
HCT: 47.6 % (ref 39.0–52.0)
Hemoglobin: 15.9 g/dL (ref 13.0–17.0)
MCH: 31.2 pg (ref 26.0–34.0)
MCHC: 33.4 g/dL (ref 30.0–36.0)
MCV: 93.5 fL (ref 80.0–100.0)
Platelets: 235 10*3/uL (ref 150–400)
RBC: 5.09 MIL/uL (ref 4.22–5.81)
RDW: 13.3 % (ref 11.5–15.5)
WBC: 13 10*3/uL — ABNORMAL HIGH (ref 4.0–10.5)
nRBC: 0 % (ref 0.0–0.2)

## 2022-03-08 LAB — TROPONIN I (HIGH SENSITIVITY): Troponin I (High Sensitivity): 7 ng/L (ref <18)

## 2022-03-08 NOTE — ED Triage Notes (Signed)
Pt comes from home via POV c/o shortness of breath. Pt states SOB started yesterday but has gotten worse. Pt has hx of asthma. Pt used inhalers with no relief of symptoms. NAD at this time

## 2022-03-09 ENCOUNTER — Encounter: Payer: Self-pay | Admitting: Internal Medicine

## 2022-03-09 ENCOUNTER — Observation Stay: Payer: Medicaid Other

## 2022-03-09 ENCOUNTER — Inpatient Hospital Stay
Admission: EM | Admit: 2022-03-09 | Discharge: 2022-03-11 | DRG: 871 | Disposition: A | Discharge status: Home / Self Care | Payer: Medicaid Other | Attending: Osteopathic Medicine | Admitting: Osteopathic Medicine

## 2022-03-09 DIAGNOSIS — R0602 Shortness of breath: Secondary | ICD-10-CM | POA: Diagnosis present

## 2022-03-09 DIAGNOSIS — J101 Influenza due to other identified influenza virus with other respiratory manifestations: Secondary | ICD-10-CM | POA: Diagnosis present

## 2022-03-09 DIAGNOSIS — F17200 Nicotine dependence, unspecified, uncomplicated: Secondary | ICD-10-CM | POA: Diagnosis not present

## 2022-03-09 DIAGNOSIS — I2510 Atherosclerotic heart disease of native coronary artery without angina pectoris: Secondary | ICD-10-CM | POA: Diagnosis present

## 2022-03-09 DIAGNOSIS — J9601 Acute respiratory failure with hypoxia: Secondary | ICD-10-CM | POA: Diagnosis present

## 2022-03-09 DIAGNOSIS — J4541 Moderate persistent asthma with (acute) exacerbation: Secondary | ICD-10-CM

## 2022-03-09 DIAGNOSIS — Z79899 Other long term (current) drug therapy: Secondary | ICD-10-CM | POA: Diagnosis not present

## 2022-03-09 DIAGNOSIS — E782 Mixed hyperlipidemia: Secondary | ICD-10-CM | POA: Diagnosis not present

## 2022-03-09 DIAGNOSIS — A419 Sepsis, unspecified organism: Secondary | ICD-10-CM | POA: Diagnosis not present

## 2022-03-09 DIAGNOSIS — T461X6A Underdosing of calcium-channel blockers, initial encounter: Secondary | ICD-10-CM | POA: Diagnosis present

## 2022-03-09 DIAGNOSIS — R6520 Severe sepsis without septic shock: Secondary | ICD-10-CM | POA: Diagnosis present

## 2022-03-09 DIAGNOSIS — T486X6A Underdosing of antiasthmatics, initial encounter: Secondary | ICD-10-CM | POA: Diagnosis present

## 2022-03-09 DIAGNOSIS — E785 Hyperlipidemia, unspecified: Secondary | ICD-10-CM | POA: Diagnosis present

## 2022-03-09 DIAGNOSIS — N5201 Erectile dysfunction due to arterial insufficiency: Secondary | ICD-10-CM

## 2022-03-09 DIAGNOSIS — J45901 Unspecified asthma with (acute) exacerbation: Secondary | ICD-10-CM | POA: Diagnosis present

## 2022-03-09 DIAGNOSIS — J439 Emphysema, unspecified: Secondary | ICD-10-CM | POA: Diagnosis present

## 2022-03-09 DIAGNOSIS — I10 Essential (primary) hypertension: Secondary | ICD-10-CM | POA: Diagnosis present

## 2022-03-09 DIAGNOSIS — E876 Hypokalemia: Secondary | ICD-10-CM | POA: Diagnosis present

## 2022-03-09 DIAGNOSIS — R911 Solitary pulmonary nodule: Secondary | ICD-10-CM | POA: Diagnosis present

## 2022-03-09 DIAGNOSIS — A4189 Other specified sepsis: Secondary | ICD-10-CM | POA: Diagnosis present

## 2022-03-09 DIAGNOSIS — F17210 Nicotine dependence, cigarettes, uncomplicated: Secondary | ICD-10-CM | POA: Diagnosis present

## 2022-03-09 DIAGNOSIS — Z91138 Patient's unintentional underdosing of medication regimen for other reason: Secondary | ICD-10-CM | POA: Diagnosis not present

## 2022-03-09 DIAGNOSIS — Z1152 Encounter for screening for COVID-19: Secondary | ICD-10-CM | POA: Diagnosis not present

## 2022-03-09 LAB — HIV ANTIBODY (ROUTINE TESTING W REFLEX): HIV Screen 4th Generation wRfx: NONREACTIVE

## 2022-03-09 LAB — LACTIC ACID, PLASMA
Lactic Acid, Venous: 5.4 mmol/L (ref 0.5–1.9)
Lactic Acid, Venous: 1.7 mmol/L (ref 0.5–1.9)

## 2022-03-09 LAB — MAGNESIUM: Magnesium: 2 mg/dL (ref 1.7–2.4)

## 2022-03-09 LAB — PROCALCITONIN: Procalcitonin: <0.1 ng/mL

## 2022-03-09 LAB — RESP PANEL BY RT-PCR (RSV, FLU A&B, COVID)  RVPGX2
Influenza A by PCR: POSITIVE — AB
Influenza B by PCR: NEGATIVE
Resp Syncytial Virus by PCR: NEGATIVE
SARS Coronavirus 2 by RT PCR: NEGATIVE

## 2022-03-09 LAB — TROPONIN I (HIGH SENSITIVITY): Troponin I (High Sensitivity): 7 ng/L (ref <18)

## 2022-03-09 LAB — D-DIMER, QUANTITATIVE: D-Dimer, Quant: 0.52 ug/mL-FEU — ABNORMAL HIGH (ref 0.00–0.50)

## 2022-03-09 MED ORDER — AMLODIPINE BESYLATE 10 MG PO TABS
10.0000 mg | ORAL_TABLET | Freq: Every day | ORAL | Status: DC
  Administered 2022-03-10 – 2022-03-11 (×2): 10 mg via ORAL
  Filled 2022-03-09 (×2): qty 1

## 2022-03-09 MED ORDER — METHYLPREDNISOLONE SODIUM SUCC 40 MG IJ SOLR
40.0000 mg | Freq: Two times a day (BID) | INTRAMUSCULAR | Status: DC
  Administered 2022-03-09 – 2022-03-11 (×5): 40 mg via INTRAVENOUS
  Filled 2022-03-09 (×5): qty 1

## 2022-03-09 MED ORDER — ONDANSETRON HCL 4 MG PO TABS: 4.0000 mg | ORAL_TABLET | Freq: Four times a day (QID) | ORAL | Status: DC | PRN

## 2022-03-09 MED ORDER — AMLODIPINE BESYLATE 5 MG PO TABS: 10.0000 mg | ORAL_TABLET | Freq: Every day | ORAL | Status: DC

## 2022-03-09 MED ORDER — AMLODIPINE BESYLATE 5 MG PO TABS
10.0000 mg | ORAL_TABLET | Freq: Once | ORAL | Status: AC
  Administered 2022-03-09: 10 mg via ORAL
  Filled 2022-03-09: qty 2

## 2022-03-09 MED ORDER — IPRATROPIUM-ALBUTEROL 0.5-2.5 (3) MG/3ML IN SOLN
3.0000 mL | RESPIRATORY_TRACT | Status: DC
  Administered 2022-03-09 (×4): 3 mL via RESPIRATORY_TRACT
  Filled 2022-03-09 (×4): qty 3

## 2022-03-09 MED ORDER — SODIUM CHLORIDE 0.9 % IV SOLN: INTRAVENOUS | Status: DC

## 2022-03-09 MED ORDER — OSELTAMIVIR PHOSPHATE 75 MG PO CAPS
75.0000 mg | ORAL_CAPSULE | Freq: Two times a day (BID) | ORAL | Status: DC
  Administered 2022-03-09 – 2022-03-11 (×5): 75 mg via ORAL
  Filled 2022-03-09 (×5): qty 1

## 2022-03-09 MED ORDER — ALBUTEROL SULFATE (2.5 MG/3ML) 0.083% IN NEBU: 2.5000 mg | INHALATION_SOLUTION | RESPIRATORY_TRACT | Status: DC | PRN

## 2022-03-09 MED ORDER — LORAZEPAM 2 MG/ML IJ SOLN: 1.0000 mg | Freq: Once | INTRAMUSCULAR | Status: DC

## 2022-03-09 MED ORDER — TRAZODONE HCL 50 MG PO TABS
25.0000 mg | ORAL_TABLET | Freq: Every evening | ORAL | Status: DC | PRN
  Administered 2022-03-09: 25 mg via ORAL
  Filled 2022-03-09: qty 1

## 2022-03-09 MED ORDER — POTASSIUM CHLORIDE CRYS ER 20 MEQ PO TBCR
40.0000 meq | EXTENDED_RELEASE_TABLET | Freq: Once | ORAL | Status: AC
  Administered 2022-03-09: 40 meq via ORAL
  Filled 2022-03-09: qty 2

## 2022-03-09 MED ORDER — ACETAMINOPHEN 650 MG RE SUPP: 650.0000 mg | Freq: Four times a day (QID) | RECTAL | Status: DC | PRN

## 2022-03-09 MED ORDER — AZITHROMYCIN 500 MG PO TABS
250.0000 mg | ORAL_TABLET | Freq: Every day | ORAL | Status: DC
  Administered 2022-03-10 – 2022-03-11 (×2): 250 mg via ORAL
  Filled 2022-03-09 (×2): qty 1

## 2022-03-09 MED ORDER — ENOXAPARIN SODIUM 40 MG/0.4ML IJ SOSY
40.0000 mg | PREFILLED_SYRINGE | INTRAMUSCULAR | Status: DC
  Administered 2022-03-09 – 2022-03-11 (×3): 40 mg via SUBCUTANEOUS
  Filled 2022-03-09 (×3): qty 0.4

## 2022-03-09 MED ORDER — DM-GUAIFENESIN ER 30-600 MG PO TB12
1.0000 | ORAL_TABLET | Freq: Two times a day (BID) | ORAL | Status: DC | PRN
  Administered 2022-03-09: 1 via ORAL
  Filled 2022-03-09: qty 1

## 2022-03-09 MED ORDER — IPRATROPIUM-ALBUTEROL 0.5-2.5 (3) MG/3ML IN SOLN: 3.0000 mL | RESPIRATORY_TRACT | Status: DC | PRN

## 2022-03-09 MED ORDER — AZITHROMYCIN 500 MG PO TABS
500.0000 mg | ORAL_TABLET | Freq: Every day | ORAL | Status: AC
  Administered 2022-03-09: 500 mg via ORAL
  Filled 2022-03-09: qty 1

## 2022-03-09 MED ORDER — IPRATROPIUM-ALBUTEROL 0.5-2.5 (3) MG/3ML IN SOLN
3.0000 mL | Freq: Once | RESPIRATORY_TRACT | Status: AC
  Administered 2022-03-09: 3 mL via RESPIRATORY_TRACT
  Filled 2022-03-09: qty 3

## 2022-03-09 MED ORDER — SODIUM CHLORIDE 0.9 % IV BOLUS
1000.0000 mL | Freq: Once | INTRAVENOUS | Status: AC
  Administered 2022-03-09: 1000 mL via INTRAVENOUS

## 2022-03-09 MED ORDER — ALBUTEROL SULFATE HFA 108 (90 BASE) MCG/ACT IN AERS
2.0000 | INHALATION_SPRAY | Freq: Once | RESPIRATORY_TRACT | Status: AC
  Administered 2022-03-09: 2 via RESPIRATORY_TRACT
  Filled 2022-03-09: qty 6.7

## 2022-03-09 MED ORDER — PREDNISONE 20 MG PO TABS
60.0000 mg | ORAL_TABLET | Freq: Once | ORAL | Status: AC
  Administered 2022-03-09: 60 mg via ORAL
  Filled 2022-03-09: qty 3

## 2022-03-09 MED ORDER — MAGNESIUM HYDROXIDE 400 MG/5ML PO SUSP: 30.0000 mL | Freq: Every day | ORAL | Status: DC | PRN

## 2022-03-09 MED ORDER — IOHEXOL 350 MG/ML SOLN
75.0000 mL | Freq: Once | INTRAVENOUS | Status: AC | PRN
  Administered 2022-03-09: 75 mL via INTRAVENOUS

## 2022-03-09 MED ORDER — NICOTINE 21 MG/24HR TD PT24
21.0000 mg | MEDICATED_PATCH | Freq: Every day | TRANSDERMAL | Status: DC
  Filled 2022-03-09: qty 1

## 2022-03-09 MED ORDER — VITAMIN B-12 1000 MCG PO TABS
500.0000 ug | ORAL_TABLET | Freq: Every day | ORAL | Status: DC
  Administered 2022-03-09 – 2022-03-11 (×3): 500 ug via ORAL
  Filled 2022-03-09: qty 0.5
  Filled 2022-03-09 (×2): qty 1

## 2022-03-09 MED ORDER — ONDANSETRON HCL 4 MG/2ML IJ SOLN: 4.0000 mg | Freq: Four times a day (QID) | INTRAMUSCULAR | Status: DC | PRN

## 2022-03-09 MED ORDER — ACETAMINOPHEN 325 MG PO TABS
650.0000 mg | ORAL_TABLET | Freq: Four times a day (QID) | ORAL | Status: DC | PRN
  Administered 2022-03-09 (×2): 650 mg via ORAL
  Filled 2022-03-09 (×2): qty 2

## 2022-03-09 MED ORDER — SODIUM CHLORIDE 0.9 % IV BOLUS
1500.0000 mL | Freq: Once | INTRAVENOUS | Status: AC
  Administered 2022-03-09: 1500 mL via INTRAVENOUS

## 2022-03-09 MED ORDER — LORAZEPAM 1 MG PO TABS
1.0000 mg | ORAL_TABLET | Freq: Once | ORAL | Status: AC
  Administered 2022-03-09: 1 mg via ORAL
  Filled 2022-03-09: qty 1

## 2022-03-09 MED ORDER — ALBUTEROL SULFATE (2.5 MG/3ML) 0.083% IN NEBU
5.0000 mg | INHALATION_SOLUTION | Freq: Once | RESPIRATORY_TRACT | Status: AC
  Administered 2022-03-09: 5 mg via RESPIRATORY_TRACT
  Filled 2022-03-09: qty 6

## 2022-03-09 NOTE — ED Notes (Signed)
Pt ambulated around department with portable pulse ox. Pt Spo2 dipped to 87% while ambulating. MD made aware.

## 2022-03-09 NOTE — ED Provider Notes (Signed)
[facility] Provider Note    Event Date/Time   First MD Initiated Contact with Patient 03/09/22 0102     (approximate)   History   Shortness of Breath   HPI  Michael Chaney is a 66 y.o. male with history of asthma, hypertension, hyperlipidemia who reports he has been out of his albuterol and amlodipine 10 mg for the past 2 weeks who presents today with cough, congestion and shortness of breath.  Reports productive cough with yellow sputum.  He is not having any chest pain but feels like he cannot breathe.  He states he has been wheezing at home.  No vomiting or diarrhea.  Has not yet been vaccinated for the flu.   History provided by patient and wife.    Past Medical History:  Diagnosis Date   Dyspnea    Hyperlipidemia    Hypertension     Past Surgical History:  Procedure Laterality Date   XI ROBOTIC ASSISTED INGUINAL HERNIA REPAIR WITH MESH Bilateral 09/28/2020   Procedure: XI ROBOTIC ASSISTED INGUINAL HERNIA REPAIR WITH MESH;  Surgeon: Carolan Shiver, MD;  Location: [facility] ORS;  Service: General;  Laterality: Bilateral;    MEDICATIONS:  Prior to Admission medications   Medication Sig Start Date End Date Taking? Authorizing Provider  amLODipine (NORVASC) 5 MG tablet TAKE ONE TABLET BY MOUTH EVERY DAY Patient taking differently: Take 5 mg by mouth in the morning. 06/23/20 06/23/21  Iloabachie, Chioma E, NP  Cyanocobalamin (B-12 PO) Take 1 tablet by mouth daily.    [provider]  ibuprofen (ADVIL) 600 MG tablet Take 1 tablet (600 mg total) by mouth every 8 (eight) hours as needed for moderate pain. 09/16/20   Shaune Pollack, MD  lidocaine (LIDODERM) 5 % Place 1 patch onto the skin every 12 (twelve) hours. Remove & Discard patch within 12 hours or as directed by MD 06/15/21 06/15/22  Phineas Semen, MD  predniSONE (STERAPRED UNI-PAK 21 TAB) 10 MG (21) TBPK tablet Per packaging instructions 06/15/21   Phineas Semen, MD  tadalafil  (CIALIS) 20 MG tablet Take 1 tablet by mouth 1 hour prior to intercourse 02/03/21   Stoioff, Verna Czech, MD  trolamine salicylate (ASPERCREME) 10 % cream Apply 1 application topically as needed for muscle pain.    [provider]    Physical Exam   Triage Vital Signs: ED Triage Vitals [03/08/22 2224]  Enc Vitals Group     BP (!) 179/111     Pulse Rate 93     Resp 20     Temp 98.6 F (37 C)     Temp Source Oral     SpO2 96 %     Weight 160 lb (72.6 kg)     Height 5\' 8"  (1.727 m)     Head Circumference      Peak Flow      Pain Score      Pain Loc      Pain Edu?      Excl. in GC?     Most recent vital signs: Vitals:   03/09/22 0430 03/09/22 0500  BP: (!) 158/97 (!) 148/96  Pulse: (!) 112 (!) 104  Resp: (!) 26 (!) 22  Temp:    SpO2: 94% 94%    CONSTITUTIONAL: Alert and oriented and responds appropriately to questions. Well-appearing; well-nourished HEAD: Normocephalic, atraumatic EYES: Conjunctivae clear, pupils appear equal, sclera nonicteric ENT: normal nose; moist mucous membranes NECK: Supple, normal ROM CARD: RRR; S1 and S2 appreciated;  no murmurs, no clicks, no rubs, no gallops RESP: Normal chest excursion without splinting or tachypnea; breath sounds clear and equal bilaterally; no wheezes, no rhonchi, no rales, no hypoxia or respiratory distress, speaking full sentences ABD/GI: Normal bowel sounds; non-distended; soft, non-tender, no rebound, no guarding, no peritoneal signs BACK: The back appears normal EXT: Normal ROM in all joints; no deformity noted, no edema; no cyanosis SKIN: Normal color for age and race; warm; no rash on exposed skin NEURO: Moves all extremities equally, normal speech PSYCH: The patient's mood and manner are appropriate.   ED Results / Procedures / Treatments   LABS: (all labs ordered are listed, but only abnormal results are displayed) Labs Reviewed  RESP PANEL BY RT-PCR (RSV, FLU A&B, COVID)  RVPGX2 - Abnormal; Notable for  the following components:      Result Value   Influenza A by PCR POSITIVE (*)    All other components within normal limits  BASIC METABOLIC PANEL - Abnormal; Notable for the following components:   Potassium 3.3 (*)    CO2 20 (*)    Glucose, Bld 142 (*)    All other components within normal limits  CBC - Abnormal; Notable for the following components:   WBC 13.0 (*)    All other components within normal limits  D-DIMER, QUANTITATIVE - Abnormal; Notable for the following components:   D-Dimer, Quant 0.52 (*)    All other components within normal limits  HIV ANTIBODY (ROUTINE TESTING W REFLEX)  TROPONIN I (HIGH SENSITIVITY)  TROPONIN I (HIGH SENSITIVITY)     EKG:  EKG Interpretation  Date/Time:  Thursday March 08 2022 22:29:19 EST Ventricular Rate:  76 PR Interval:  148 QRS Duration: 88 QT Interval:  364 QTC Calculation: 409 R Axis:   76 Text Interpretation: Normal sinus rhythm Normal ECG When compared with ECG of 08-Nov-2021 19:02, No significant change was found Confirmed by Rochele Raring [phone]) on 03/09/2022 2:50:18 AM         RADIOLOGY: My personal review and interpretation of imaging: Chest x-ray clear.  I have personally reviewed all radiology reports.   DG Chest 2 View  Result Date: 03/08/2022 CLINICAL DATA:  SOB EXAM: CHEST - 2 VIEW COMPARISON:  Chest x-ray 11/08/2021 FINDINGS: The heart and mediastinal contours are unchanged. Aortic calcification. No focal consolidation. No pulmonary edema. No pleural effusion. No pneumothorax. No acute osseous abnormality. IMPRESSION: 1. No active cardiopulmonary disease. 2.  Aortic Atherosclerosis (ICD10-I70.0). Electronically Signed   By: Tish Frederickson M.D.   On: 03/08/2022 23:34     PROCEDURES:  Critical Care performed: Yes, see critical care procedure note(s)   CRITICAL CARE Performed by: Baxter Hire Katheleen Stella   Total critical care time: 40 minutes  Critical care time was exclusive of separately billable procedures  and treating other patients.  Critical care was necessary to treat or prevent imminent or life-threatening deterioration.  Critical care was time spent personally by me on the following activities: development of treatment plan with patient and/or surrogate as well as nursing, discussions with consultants, evaluation of patient's response to treatment, examination of patient, obtaining history from patient or surrogate, ordering and performing treatments and interventions, ordering and review of laboratory studies, ordering and review of radiographic studies, pulse oximetry and re-evaluation of patient's condition.   Marland Kitchen1-3 Lead EKG Interpretation  Performed by: Breckin Zafar, Layla Maw, DO Authorized by: Raun Routh, Layla Maw, DO     Interpretation: abnormal     ECG rate:  104   ECG rate assessment:  tachycardic     Rhythm: sinus tachycardia     Ectopy: none     Conduction: normal       IMPRESSION / MDM / ASSESSMENT AND PLAN / ED COURSE  I reviewed the triage vital signs and the nursing notes.    Patient here for shortness of breath, cough and congestion.  The patient is on the cardiac monitor to evaluate for evidence of arrhythmia and/or significant heart rate changes.   DIFFERENTIAL DIAGNOSIS (includes but not limited to):   Flu, COVID, RSV, pneumonia, asthma exacerbation, PE, ACS, CHF   Patient's presentation is most consistent with acute presentation with potential threat to life or bodily function.   PLAN: Patient is tachypneic and appears dyspneic talking to me but no hypoxia at rest.  He states he feels like he is having asthma exacerbation but he is not wheezing.  Will give breathing treatments and steroids to see if this helps him symptomatically as he states he has been wheezing at home.  Will obtain COVID and flu swabs.  Will obtain CBC, BMP, troponin x 2, chest x-ray.   MEDICATIONS GIVEN IN ED: Medications  oseltamivir (TAMIFLU) capsule 75 mg (75 mg Oral Given 03/09/22 0542)   amLODipine (NORVASC) tablet 10 mg (has no administration in time range)  cyanocobalamin (VITAMIN B12) tablet 500 mcg (has no administration in time range)  enoxaparin (LOVENOX) injection 40 mg (has no administration in time range)  0.9 %  sodium chloride infusion ( Intravenous New Bag/Given 03/09/22 0542)  acetaminophen (TYLENOL) tablet 650 mg (has no administration in time range)    Or  acetaminophen (TYLENOL) suppository 650 mg (has no administration in time range)  traZODone (DESYREL) tablet 25 mg (has no administration in time range)  magnesium hydroxide (MILK OF MAGNESIA) suspension 30 mL (has no administration in time range)  ondansetron (ZOFRAN) tablet 4 mg (has no administration in time range)    Or  ondansetron (ZOFRAN) injection 4 mg (has no administration in time range)  ipratropium-albuterol (DUONEB) 0.5-2.5 (3) MG/3ML nebulizer solution 3 mL (3 mLs Nebulization Given 03/09/22 0159)  amLODipine (NORVASC) tablet 10 mg (10 mg Oral Given 03/09/22 0155)  predniSONE (DELTASONE) tablet 60 mg (60 mg Oral Given 03/09/22 0155)  potassium chloride SA (KLOR-CON M) CR tablet 40 mEq (40 mEq Oral Given 03/09/22 0155)  LORazepam (ATIVAN) tablet 1 mg (1 mg Oral Given 03/09/22 0243)  albuterol (PROVENTIL) (2.5 MG/3ML) 0.083% nebulizer solution 5 mg (5 mg Nebulization Given 03/09/22 0316)  albuterol (VENTOLIN HFA) 108 (90 Base) MCG/ACT inhaler 2 puff (2 puffs Inhalation Given 03/09/22 0334)     ED COURSE: Patient's labs show slight leukocytosis of 13,000.  Potassium of 3.3.  Given replacement.  Troponin x 2 negative.  Age-adjusted D-dimer is negative.  Chest x-ray reviewed and interpreted by myself and the radiologist and shows no acute abnormality.  Patient is screaming at nurse that he feels like he cannot breathe.  I suspect there may be some component of anxiety given his lungs are clear to auscultation and his sats on room air 100%.  Will give Ativan and reassess.    Patient seems more  calm but states he still feels short of breath.  He would like to try another breathing treatment although lungs are still clear.  COVID and flu pending.    Patient has come back positive for influenza A.  He is unvaccinated.  No hypoxia at rest but when he ambulates his sats quickly dropped to 87%.  He does  not wear oxygen at home.  Will discuss with hospitalist for admission.   CONSULTS:  Consulted and discussed patient's case with hospitalist, Dr. Arville Care.  I have recommended admission and consulting physician agrees and will place admission orders.  Patient (and family if present) agree with this plan.   I reviewed all nursing notes, vitals, pertinent previous records.  All labs, EKGs, imaging ordered have been independently reviewed and interpreted by myself.    OUTSIDE RECORDS REVIEWED: Reviewed patient's last PCP note on 02/10/2021.       FINAL CLINICAL IMPRESSION(S) / ED DIAGNOSES   Final diagnoses:  Influenza A  Acute respiratory failure with hypoxia (HCC)     Rx / DC Orders   ED Discharge Orders     None        Note:  This document was prepared using Dragon voice recognition software and may include unintentional dictation errors.   Keyerra Lamere, Layla Maw, DO 03/09/22 [phone]

## 2022-03-09 NOTE — ED Notes (Signed)
Swabs, 2nd troponin, nebs, amb sat   Caitland Porchia, Layla Maw, DO 03/09/22 0127

## 2022-03-09 NOTE — H&P (Signed)
History and Physical    Michael Chaney [id number] DOB: 1955/04/29 DOA: 03/09/2022  Referring MD/NP/PA:   PCP: Pcp, No   Patient coming from:  The patient is coming from home.  At baseline, pt is independent for most of ADL.        Chief Complaint: Shortness of breath  HPI: Michael Chaney is a 66 y.o. male with medical history significant of hypertension, hyperlipidemia, asthma, tobacco abuse, who presents with shortness breath.  Patient states that he started having shortness of breath since yesterday, which has been progressively worsening.  Patient has cough with yellow-colored sputum production.  Patient also has wheezing, no fever, chills, chest pain.  Patient does not have nausea vomiting, diarrhea or abdominal pain.  No symptoms of UTI. Patient is not using oxygen normally.  Patient was found to have oxygen desaturation to 87% on ambulation, 96% at the resting on room air.  Data reviewed independently and ED Course: pt was found to have positive flu PCR, positive D-dimer 0.52, potassium 3.3, GFR> 60, troponin level 7 x 2, WBC 13.0, temperature normal, blood pressure 157/86, heart rate 150, 96, RR 26, chest x-ray negative.  Lower extremity venous Doppler negative for DVT.  CTA negative for PE.  Patient is placed on telemetry bed for obs.  CTA: 1. No evidence of pulmonary embolism or other acute intrathoracic findings. 2. Stable 6.7 mm subpleural nodule within the right upper lobe. 3. Severe emphysema (ICD10-J43.9). 4. Aortic and coronary artery atherosclerosis (ICD10-I70.0).   EKG: I have personally reviewed.  Sinus rhythm, QTc 409, low voltage in lead I, possible left atrial enlargement.   Review of Systems:   General: no fevers, chills, no body weight gain, has poor appetite, has fatigue HEENT: no blurry vision, hearing changes Respiratory: has dyspnea, coughing, wheezing CV: no chest pain, no palpitations GI: no nausea, vomiting, abdominal pain, diarrhea,  constipation GU: no dysuria, burning on urination, increased urinary frequency, hematuria  Ext: no leg edema Neuro: no unilateral weakness, numbness, or tingling, no vision change or hearing loss Skin: no rash, no skin tear. MSK: No muscle spasm, no deformity, no limitation of range of movement in spin Heme: No easy bruising.  Travel history: No recent long distant travel.   Allergy:  Allergies  Allergen Reactions   Bee Venom Swelling    Past Medical History:  Diagnosis Date   Dyspnea    Hyperlipidemia    Hypertension     Past Surgical History:  Procedure Laterality Date   XI ROBOTIC ASSISTED INGUINAL HERNIA REPAIR WITH MESH Bilateral 09/28/2020   Procedure: XI ROBOTIC ASSISTED INGUINAL HERNIA REPAIR WITH MESH;  Surgeon: Herbert Pun, MD;  Location: [facility] ORS;  Service: General;  Laterality: Bilateral;    Social History:  reports that he has been smoking cigarettes. He has a 35.25 pack-year smoking history. He has never used smokeless tobacco. He reports that he does not currently use alcohol. He reports current drug use. Drug: Marijuana.  Family History:  Family History  Problem Relation Age of Onset   Dementia Mother      Prior to Admission medications   Medication Sig Start Date End Date Taking? Authorizing Provider  albuterol (VENTOLIN HFA) 108 (90 Base) MCG/ACT inhaler Inhale 1-2 puffs into the lungs every 4 (four) hours as needed. 12/01/20  Yes [provider]  amLODipine (NORVASC) 10 MG tablet Take 10 mg by mouth daily.   Yes [provider]  Cyanocobalamin (B-12 PO) Take 1 tablet by mouth daily.   Yes  [provider]  ibuprofen (ADVIL) 600 MG tablet Take 1 tablet (600 mg total) by mouth every 8 (eight) hours as needed for moderate pain. Patient not taking: Reported on 03/09/2022 09/16/20   Shaune Pollack, MD  lidocaine (LIDODERM) 5 % Place 1 patch onto the skin every 12 (twelve) hours. Remove & Discard patch within 12 hours or as  directed by MD Patient not taking: Reported on 03/09/2022 06/15/21 06/15/22  Phineas Semen, MD  tadalafil (CIALIS) 20 MG tablet Take 1 tablet by mouth 1 hour prior to intercourse Patient not taking: Reported on 03/09/2022 02/03/21   [name], MD  trolamine salicylate (ASPERCREME) 10 % cream Apply 1 application topically as needed for muscle pain. Patient not taking: Reported on 03/09/2022    [provider]    Physical Exam: Vitals:   03/09/22 1318 03/09/22 1330 03/09/22 1500 03/09/22 1530  BP:  (!) 153/88 (!) 146/86 (!) 154/92  Pulse:  (!) 107 91 93  Resp:   (!) 22 (!) 26  Temp: 99.6 F (37.6 C)     TempSrc: Oral     SpO2:  97% 91% 93%  Weight:      Height:       General: Not in acute distress HEENT:       Eyes: PERRL, EOMI, no scleral icterus.       ENT: No discharge from the ears and nose, no pharynx injection, no tonsillar enlargement.        Neck: No JVD, no bruit, no mass felt. Heme: No neck lymph node enlargement. Cardiac: S1/S2, RRR, No murmurs, No gallops or rubs. Respiratory: has wheezing bilaterally GI: Soft, nondistended, nontender, no rebound pain, no organomegaly, BS present. GU: No hematuria Ext: No pitting leg edema bilaterally. 1+DP/PT pulse bilaterally. Musculoskeletal: No joint deformities, No joint redness or warmth, no limitation of ROM in spin. Skin: No rashes.  Neuro: Alert, oriented X3, cranial nerves II-XII grossly intact, moves all extremities normally.  Psych: Patient is not psychotic, no suicidal or hemocidal ideation.  Labs on Admission: I have personally reviewed following labs and imaging studies  CBC: Recent Labs  Lab 03/08/22 2246  WBC 13.0*  HGB 15.9  HCT 47.6  MCV 93.5  PLT 235   Basic Metabolic Panel: Recent Labs  Lab 03/08/22 2246 03/09/22 0100  NA 137  --   K 3.3*  --   CL 105  --   CO2 20*  --   GLUCOSE 142*  --   BUN 15  --   CREATININE 0.86  --   CALCIUM 9.4  --   MG  --  2.0   GFR: Estimated  Creatinine Clearance: 80.6 mL/min (by C-G formula based on SCr of 0.86 mg/dL). Liver Function Tests: No results for input(s): "AST", "ALT", "ALKPHOS", "BILITOT", "PROT", "ALBUMIN" in the last 168 hours. No results for input(s): "LIPASE", "AMYLASE" in the last 168 hours. No results for input(s): "AMMONIA" in the last 168 hours. Coagulation Profile: No results for input(s): "INR", "PROTIME" in the last 168 hours. Cardiac Enzymes: No results for input(s): "CKTOTAL", "CKMB", "CKMBINDEX", "TROPONINI" in the last 168 hours. BNP (last 3 results) No results for input(s): "PROBNP" in the last 8760 hours. HbA1C: No results for input(s): "HGBA1C" in the last 72 hours. CBG: No results for input(s): "GLUCAP" in the last 168 hours. Lipid Profile: No results for input(s): "CHOL", "HDL", "LDLCALC", "TRIG", "CHOLHDL", "LDLDIRECT" in the last 72 hours. Thyroid Function Tests: No results for input(s): "TSH", "T4TOTAL", "FREET4", "T3FREE", "  THYROIDAB" in the last 72 hours. Anemia Panel: No results for input(s): "VITAMINB12", "FOLATE", "FERRITIN", "TIBC", "IRON", "RETICCTPCT" in the last 72 hours. Urine analysis:    Component Value Date/Time   COLORURINE YELLOW (A) 09/16/2020 1011   APPEARANCEUR CLOUDY (A) 09/16/2020 1011   APPEARANCEUR Turbid (A) 03/02/2020 1045   LABSPEC 1.018 09/16/2020 1011   PHURINE 6.0 09/16/2020 1011   GLUCOSEU NEGATIVE 09/16/2020 1011   HGBUR NEGATIVE 09/16/2020 [place] 09/16/2020 1011   BILIRUBINUR Negative 03/02/2020 [place] 09/16/2020 [place] 09/16/2020 1011   NITRITE NEGATIVE 09/16/2020 [place] 09/16/2020 1011   Sepsis Labs: @LABRCNTIP (procalcitonin:4,lacticidven:4) ) Recent Results (from the past 240 hour(s))  Resp panel by RT-PCR (RSV, Flu A&B, Covid) Anterior Nasal Swab     Status: Abnormal   Collection Time: 03/09/22  2:02 AM   Specimen: Anterior Nasal Swab  Result Value Ref Range  Status   SARS Coronavirus 2 by RT PCR NEGATIVE NEGATIVE Final   Influenza A by PCR POSITIVE (A) NEGATIVE Final   Influenza B by PCR NEGATIVE NEGATIVE Final   Resp Syncytial Virus by PCR NEGATIVE NEGATIVE Final    Comment: Performed at [facility], [address], [place], [place] [address]     Radiological Exams on Admission: US Venous Img Lower Bilateral (DVT)  Result Date: 03/09/2022 CLINICAL DATA:  66 year old male with positive D-dimer. EXAM: BILATERAL LOWER EXTREMITY VENOUS DOPPLER ULTRASOUND TECHNIQUE: Gray-scale sonography with graded compression, as well as color Doppler and duplex ultrasound were performed to evaluate the lower extremity deep venous systems from the level of the common femoral vein and including the common femoral, femoral, profunda femoral, popliteal and calf veins including the posterior tibial, peroneal and gastrocnemius veins when visible. The superficial great saphenous vein was also interrogated. Spectral Doppler was utilized to evaluate flow at rest and with distal augmentation maneuvers in the common femoral, femoral and popliteal veins. COMPARISON:  None Available. FINDINGS: RIGHT LOWER EXTREMITY Common Femoral Vein: No evidence of thrombus. Normal compressibility, respiratory phasicity and response to augmentation. Saphenofemoral Junction: No evidence of thrombus. Normal compressibility and flow on color Doppler imaging. Profunda Femoral Vein: No evidence of thrombus. Normal compressibility and flow on color Doppler imaging. Femoral Vein: No evidence of thrombus. Normal compressibility, respiratory phasicity and response to augmentation. Popliteal Vein: No evidence of thrombus. Normal compressibility, respiratory phasicity and response to augmentation. Calf Veins: No evidence of thrombus. Normal compressibility and flow on color Doppler imaging. Other Findings:  None. LEFT LOWER EXTREMITY Common Femoral Vein: No evidence of thrombus. Normal compressibility,  respiratory phasicity and response to augmentation. Saphenofemoral Junction: No evidence of thrombus. Normal compressibility and flow on color Doppler imaging. Profunda Femoral Vein: No evidence of thrombus. Normal compressibility and flow on color Doppler imaging. Femoral Vein: No evidence of thrombus. Normal compressibility, respiratory phasicity and response to augmentation. Popliteal Vein: No evidence of thrombus. Normal compressibility, respiratory phasicity and response to augmentation. Calf Veins: No evidence of thrombus. Normal compressibility and flow on color Doppler imaging. Other Findings:  None. IMPRESSION: No evidence of bilateral lower extremity deep venous thrombosis. Ruthann Cancer, MD [facility] Electronically Signed   By: Ruthann Cancer M.D.   On: 03/09/2022 11:53   CT Angio Chest Pulmonary Embolism (PE) W or WO Contrast  Result Date: 03/09/2022 CLINICAL DATA:  Pulmonary embolism (PE) suspected, high prob EXAM: CT ANGIOGRAPHY CHEST WITH CONTRAST TECHNIQUE: Multidetector CT imaging of the  chest was performed using the standard protocol during bolus administration of intravenous contrast. Multiplanar CT image reconstructions and MIPs were obtained to evaluate the vascular anatomy. RADIATION DOSE REDUCTION: This exam was performed according to the departmental dose-optimization program which includes automated exposure control, adjustment of the mA and/or kV according to patient size and/or use of iterative reconstruction technique. CONTRAST:  63mL OMNIPAQUE IOHEXOL 350 MG/ML SOLN COMPARISON:  CT 11/18/2020 FINDINGS: Cardiovascular: Satisfactory opacification of the pulmonary arteries to the segmental level. No evidence of pulmonary embolism. Thoracic aorta is nonaneurysmal. Scattered atherosclerotic vascular calcifications of the aorta and coronary arteries. Normal heart size. No pericardial effusion. Mediastinum/Nodes: No enlarged  mediastinal, hilar, or axillary lymph nodes. Thyroid gland, trachea, and esophagus demonstrate no significant findings. Lungs/Pleura: Stable 6.7 mm subpleural nodule at the peripheral aspect of the right upper lobe (series 6, image 24). Severe centrilobular and paraseptal emphysema with upper lobe predominance. No new pulmonary nodules or masses. No airspace consolidation. No pleural effusion or pneumothorax. Upper Abdomen: No acute abnormality. Musculoskeletal: No acute osseous abnormality. Bilateral gynecomastia. Review of the MIP images confirms the above findings. IMPRESSION: 1. No evidence of pulmonary embolism or other acute intrathoracic findings. 2. Stable 6.7 mm subpleural nodule within the right upper lobe. 3. Severe emphysema (ICD10-J43.9). 4. Aortic and coronary artery atherosclerosis (ICD10-I70.0). Electronically Signed   By: Davina Poke D.O.   On: 03/09/2022 10:32   DG Chest 2 View  Result Date: 03/08/2022 CLINICAL DATA:  SOB EXAM: CHEST - 2 VIEW COMPARISON:  Chest x-ray 11/08/2021 FINDINGS: The heart and mediastinal contours are unchanged. Aortic calcification. No focal consolidation. No pulmonary edema. No pleural effusion. No pneumothorax. No acute osseous abnormality. IMPRESSION: 1. No active cardiopulmonary disease. 2.  Aortic Atherosclerosis (ICD10-I70.0). Electronically Signed   By: Iven Finn M.D.   On: 03/08/2022 23:34      Assessment/Plan Principal Problem:   Influenza A Active Problems:   Severe sepsis (HCC)   Asthma exacerbation   Essential hypertension   HLD (hyperlipidemia)   Hypokalemia   Smoking   Lung nodule   Assessment and Plan:  Influenza A and severe sepsis:  pt meets criteria for severe sepsis with WBC 13.0, heart rate of 115, RR 26.  Lactic acid is up to 5.4.  -Admitted to telemetry bed as inpatient -Tamiflu 75 mg twice daily -Start Z-Pak empirically due to leukocytosis with WBC 13.0 -Broncodur liters -IV fluid: 2.5 L normal saline, 900  cc/h - Trend Lactic acid -Check procalcitonin level  Asthma exacerbation: Patient states that he has history of asthma.  Now has wheezing.  Flu A infection likely triggered asthma exacerbation. -Bronchodilators -Solu-Medrol 40 mg twice daily (patient received 60 mg of prednisone in ED)  Essential hypertension -IV hydralazine as needed -Amlodipine  HLD (hyperlipidemia): Patient's not taking medications currently -Follow-up with PCP  Hypokalemia: Potassium 3.3 -Repleted potassium -Check magnesium level --> 2.0  Smoking -Nicotine patch  Lung nodule: CTA showed a stable 6.7 mm subpleural nodule within the right upper lobe. -History of with PCP to repeat CT-chest in 3-6 months    DVT ppx:  SQ Lovenox  Code Status: Full code  Family Communication: not done, no family member is at bed side.    Disposition Plan:  Anticipate discharge back to previous environment  Consults called:  none  Admission status and Level of care: Telemetry Medical:   as inpt          Dispo: The patient is from: Home  Anticipated d/c is to: Home              Anticipated d/c date is: 2 days              Patient currently is not medically stable to d/c.    Severity of Illness:  The appropriate patient status for this patient is INPATIENT. Inpatient status is judged to be reasonable and necessary in order to provide the required intensity of service to ensure the patient's safety. The patient's presenting symptoms, physical exam findings, and initial radiographic and laboratory data in the context of their chronic comorbidities is felt to place them at high risk for further clinical deterioration. Furthermore, it is not anticipated that the patient will be medically stable for discharge from the hospital within 2 midnights of admission.   * I certify that at the point of admission it is my clinical judgment that the patient will require inpatient hospital care spanning beyond 2 midnights  from the point of admission due to high intensity of service, high risk for further deterioration and high frequency of surveillance required.*      Date of Service 03/09/2022    [facility]   If 7PM-7AM, please contact night-coverage [email or web address] 03/09/2022, 6:07 PM

## 2022-03-10 DIAGNOSIS — J9601 Acute respiratory failure with hypoxia: Secondary | ICD-10-CM

## 2022-03-10 DIAGNOSIS — R6520 Severe sepsis without septic shock: Secondary | ICD-10-CM

## 2022-03-10 DIAGNOSIS — J4541 Moderate persistent asthma with (acute) exacerbation: Secondary | ICD-10-CM | POA: Diagnosis not present

## 2022-03-10 DIAGNOSIS — J101 Influenza due to other identified influenza virus with other respiratory manifestations: Secondary | ICD-10-CM | POA: Diagnosis not present

## 2022-03-10 DIAGNOSIS — E782 Mixed hyperlipidemia: Secondary | ICD-10-CM

## 2022-03-10 DIAGNOSIS — R911 Solitary pulmonary nodule: Secondary | ICD-10-CM

## 2022-03-10 LAB — CBC
HCT: 45.4 % (ref 39.0–52.0)
Hemoglobin: 15.4 g/dL (ref 13.0–17.0)
MCH: 31.2 pg (ref 26.0–34.0)
MCHC: 33.9 g/dL (ref 30.0–36.0)
MCV: 92.1 fL (ref 80.0–100.0)
Platelets: 199 10*3/uL (ref 150–400)
RBC: 4.93 MIL/uL (ref 4.22–5.81)
RDW: 13.6 % (ref 11.5–15.5)
WBC: 10.7 10*3/uL — ABNORMAL HIGH (ref 4.0–10.5)
nRBC: 0 % (ref 0.0–0.2)

## 2022-03-10 LAB — BASIC METABOLIC PANEL
Anion gap: 8 (ref 5–15)
BUN: 13 mg/dL (ref 8–23)
CO2: 23 mmol/L (ref 22–32)
Calcium: 9.5 mg/dL (ref 8.9–10.3)
Chloride: 108 mmol/L (ref 98–111)
Creatinine, Ser: 0.85 mg/dL (ref 0.61–1.24)
GFR, Estimated: >60 mL/min (ref >60)
Glucose, Bld: 110 mg/dL — ABNORMAL HIGH (ref 70–99)
Potassium: 4 mmol/L (ref 3.5–5.1)
Sodium: 139 mmol/L (ref 135–145)

## 2022-03-10 LAB — EXPECTORATED SPUTUM ASSESSMENT W GRAM STAIN, RFLX TO RESP C

## 2022-03-10 MED ORDER — STERILE WATER FOR INJECTION IJ SOLN
INTRAMUSCULAR | Status: AC
  Administered 2022-03-10: 10 mL via INTRAVENOUS
  Filled 2022-03-10: qty 10

## 2022-03-10 NOTE — Progress Notes (Signed)
Mobility Specialist - Progress Note   03/10/22 1054  Mobility  Activity Ambulated with assistance in hallway;Stood at bedside;Dangled on edge of bed  Level of Assistance Standby assist, set-up cues, supervision of patient - no hands on  Assistive Device None  Distance Ambulated (ft) 240 ft  Activity Response Tolerated well  Mobility Referral Yes  $Mobility charge 1 Mobility   Pt supine in bed on RA upon arrival. Pt STS and ambulates in hallway SBA with no LOB. Pt endorses SOB. Breathing/energy conservation education provided. RN notified. Pt returns to EOB with needs in reach and visitors in room.   Terrilyn Saver  Mobility Specialist  03/10/22 10:56 AM

## 2022-03-10 NOTE — Hospital Course (Addendum)
Michael Chaney is a 66 y.o. male with medical history significant of hypertension, hyperlipidemia, asthma, tobacco abuse, who presents with shortness breath x1 day which has been worsening and associated w/ yellow-colored sputum productive cough. Not on home O2 12/15: Patient was found to have oxygen desaturation to 87% on ambulation, 96% at the resting on room air.  Positive flu PCR, positive D-dimer 0.52, potassium 3.3, GFR> 60, troponin level 7 x 2, WBC 13.0, temperature normal, blood pressure 157/86, heart rate 150, 96, RR 26, chest x-ray negative.  Lower extremity venous Doppler negative for DVT and CTA negative for PE.  Patient is placed on telemetry bed for obs given was meeting sepsis criteria w/ WBC 113, HR 115, RR 26, elevated lactic acid.  12/16: WBC trending down to 10.7, Lactic acid trending down to 1.7, saturating 98% on RA  Consultants:  none  Procedures: none      ASSESSMENT & PLAN:   Principal Problem:   Influenza A Active Problems:   Severe sepsis (HCC)   Asthma exacerbation   Essential hypertension   HLD (hyperlipidemia)   Hypokalemia   Smoking   Lung nodule     Influenza A and severe sepsis:   pt meets criteria for severe sepsis with WBC 13.0, heart rate of 115, RR 26.  Lactic acid is up to 5.4. --> improved this morning Tamiflu 75 mg twice daily Z-Pak empirically due to leukocytosis with WBC 13.0 Bronchodilators S/p IV fluid: 2.5 L normal saline, 900 cc/h Check procalcitonin level - <0.10   Asthma exacerbation: Patient states that he has history of asthma, CT shows emphysema.  Now has wheezing.  Flu A infection likely triggered asthma/COPD exacerbation. Bronchodilators Solu-Medrol 40 mg twice daily (patient received 60 mg of prednisone in ED) Pt reporting still SOB though off O2 and saturating well   Essential hypertension IV hydralazine as needed Amlodipine   HLD (hyperlipidemia): Patient's not taking medications currently Follow-up with PCP    Hypokalemia:  Potassium 3.3 --> corrected Follow outpatient    Smoking Nicotine patch   Lung nodule:  CTA showed a stable 6.7 mm subpleural nodule within the right upper lobe. PCP to repeat CT-chest in 3-6 months   DVT prophylaxis: enoxaparin Pertinent IV fluids/nutrition: stopped IV fluids this morning  Central lines / invasive devices: none   Code Status: FULL CODE  Family Communication: wife at bedside on rounds  Disposition: inpatient TOC needs: none Barriers to discharge / significant pending items: anticipate d/c tomorrow if still not needing O2

## 2022-03-10 NOTE — Progress Notes (Signed)
PROGRESS NOTE    Michael Chaney   TKW:[id number] DOB: 08-05-1955  DOA: 03/09/2022 Date of Service: 03/10/22 PCP: Oneita Hurt, No     Brief Narrative / Hospital Course:  Michael Chaney is a 66 y.o. male with medical history significant of hypertension, hyperlipidemia, asthma, tobacco abuse, who presents with shortness breath x1 day which has been worsening and associated w/ yellow-colored sputum productive cough. Not on home O2 12/15: Patient was found to have oxygen desaturation to 87% on ambulation, 96% at the resting on room air.  Positive flu PCR, positive D-dimer 0.52, potassium 3.3, GFR> 60, troponin level 7 x 2, WBC 13.0, temperature normal, blood pressure 157/86, heart rate 150, 96, RR 26, chest x-ray negative.  Lower extremity venous Doppler negative for DVT and CTA negative for PE.  Patient is placed on telemetry bed for obs given was meeting sepsis criteria w/ WBC 113, HR 115, RR 26, elevated lactic acid.  12/16: WBC trending down to 10.7, Lactic acid trending down to 1.7, saturating 98% on RA  Consultants:  none  Procedures: none      ASSESSMENT & PLAN:   Principal Problem:   Influenza A Active Problems:   Severe sepsis (HCC)   Asthma exacerbation   Essential hypertension   HLD (hyperlipidemia)   Hypokalemia   Smoking   Lung nodule     Influenza A and severe sepsis:   pt meets criteria for severe sepsis with WBC 13.0, heart rate of 115, RR 26.  Lactic acid is up to 5.4. --> improved this morning Tamiflu 75 mg twice daily Z-Pak empirically due to leukocytosis with WBC 13.0 Bronchodilators S/p IV fluid: 2.5 L normal saline, 900 cc/h Check procalcitonin level - <0.10   Asthma exacerbation: Patient states that he has history of asthma, CT shows emphysema.  Now has wheezing.  Flu A infection likely triggered asthma/COPD exacerbation. Bronchodilators Solu-Medrol 40 mg twice daily (patient received 60 mg of prednisone in ED) Pt reporting still SOB though off O2  and saturating well   Essential hypertension IV hydralazine as needed Amlodipine   HLD (hyperlipidemia): Patient's not taking medications currently Follow-up with PCP   Hypokalemia:  Potassium 3.3 --> corrected Follow outpatient    Smoking Nicotine patch   Lung nodule:  CTA showed a stable 6.7 mm subpleural nodule within the right upper lobe. PCP to repeat CT-chest in 3-6 months   DVT prophylaxis: enoxaparin Pertinent IV fluids/nutrition: stopped IV fluids this morning  Central lines / invasive devices: none   Code Status: FULL CODE  Family Communication: wife at bedside on rounds  Disposition: inpatient TOC needs: none Barriers to discharge / significant pending items: anticipate d/c tomorrow if still not needing O2             Subjective:  Patient reports SOB Denies CP  Pain controlled.  Denies new weakness.  Tolerating diet.  Reports no concerns w/ urination/defecation.      Objective Findings:  Vitals:   03/09/22 2355 03/10/22 0518 03/10/22 0700 03/10/22 1133  BP: (!) 152/88 134/78 (!) 141/78 (!) 142/88  Pulse: 84 89 (!) 55 85  Resp: 16 18 16 20   Temp: 98.8 F (37.1 C) 99 F (37.2 C) 98.9 F (37.2 C) 98.9 F (37.2 C)  TempSrc:      SpO2: 95% 97% 98% 99%  Weight:      Height:        Intake/Output Summary (Last 24 hours) at 03/10/2022 1333 Last data filed at 03/10/2022 0600 Gross per 24  hour  Intake 200 ml  Output 150 ml  Net 50 ml   Filed Weights   03/08/22 2224  Weight: 72.6 kg    Examination:  Constitutional:  VS as above General Appearance: alert, well-developed, well-nourished, NAD Respiratory: Normal respiratory effort +diffuse wheeze No rhonchi No rales Cardiovascular: S1/S2 normal No murmur No rub/gallop auscultated No lower extremity edema Gastrointestinal: No tenderness Musculoskeletal:  No clubbing/cyanosis of digits Symmetrical movement in all extremities Neurological: No cranial nerve deficit on  limited exam Alert Psychiatric: Normal judgment/insight Normal mood and affect       Scheduled Medications:   amLODipine  10 mg Oral Daily   azithromycin  250 mg Oral Daily   cyanocobalamin  500 mcg Oral Daily   enoxaparin (LOVENOX) injection  40 mg Subcutaneous Q24H   methylPREDNISolone (SOLU-MEDROL) injection  40 mg Intravenous Q12H   nicotine  21 mg Transdermal Daily   oseltamivir  75 mg Oral BID    Continuous Infusions:   PRN Medications:  acetaminophen **OR** acetaminophen, albuterol, dextromethorphan-guaiFENesin, ipratropium-albuterol, magnesium hydroxide, ondansetron **OR** ondansetron (ZOFRAN) IV, traZODone  Antimicrobials:  Anti-infectives (From admission, onward)    Start     Dose/Rate Route Frequency Ordered Stop   03/10/22 1000  azithromycin (ZITHROMAX) tablet 250 mg       See Hyperspace for full Linked Orders Report.   250 mg Oral Daily 03/09/22 0804 03/14/22 0959   03/09/22 1000  azithromycin (ZITHROMAX) tablet 500 mg       See Hyperspace for full Linked Orders Report.   500 mg Oral Daily 03/09/22 0804 03/09/22 0856   03/09/22 0530  oseltamivir (TAMIFLU) capsule 75 mg        75 mg Oral 2 times daily 03/09/22 0517 03/14/22 0959           Data Reviewed: I have personally reviewed following labs and imaging studies  CBC: Recent Labs  Lab 03/08/22 2246 03/10/22 0414  WBC 13.0* 10.7*  HGB 15.9 15.4  HCT 47.6 45.4  MCV 93.5 92.1  PLT 235 199   Basic Metabolic Panel: Recent Labs  Lab 03/08/22 2246 03/09/22 0100 03/10/22 0414  NA 137  --  139  K 3.3*  --  4.0  CL 105  --  108  CO2 20*  --  23  GLUCOSE 142*  --  110*  BUN 15  --  13  CREATININE 0.86  --  0.85  CALCIUM 9.4  --  9.5  MG  --  2.0  --    GFR: Estimated Creatinine Clearance: 81.6 mL/min (by C-G formula based on SCr of 0.85 mg/dL). Liver Function Tests: No results for input(s): "AST", "ALT", "ALKPHOS", "BILITOT", "PROT", "ALBUMIN" in the last 168 hours. No results for  input(s): "LIPASE", "AMYLASE" in the last 168 hours. No results for input(s): "AMMONIA" in the last 168 hours. Coagulation Profile: No results for input(s): "INR", "PROTIME" in the last 168 hours. Cardiac Enzymes: No results for input(s): "CKTOTAL", "CKMB", "CKMBINDEX", "TROPONINI" in the last 168 hours. BNP (last 3 results) No results for input(s): "PROBNP" in the last 8760 hours. HbA1C: No results for input(s): "HGBA1C" in the last 72 hours. CBG: No results for input(s): "GLUCAP" in the last 168 hours. Lipid Profile: No results for input(s): "CHOL", "HDL", "LDLCALC", "TRIG", "CHOLHDL", "LDLDIRECT" in the last 72 hours. Thyroid Function Tests: No results for input(s): "TSH", "T4TOTAL", "FREET4", "T3FREE", "THYROIDAB" in the last 72 hours. Anemia Panel: No results for input(s): "VITAMINB12", "FOLATE", "FERRITIN", "TIBC", "IRON", "RETICCTPCT" in the last  72 hours. Most Recent Urinalysis On File:     Component Value Date/Time   COLORURINE YELLOW (A) 09/16/2020 1011   APPEARANCEUR CLOUDY (A) 09/16/2020 1011   APPEARANCEUR Turbid (A) 03/02/2020 1045   LABSPEC 1.018 09/16/2020 1011   PHURINE 6.0 09/16/2020 1011   GLUCOSEU NEGATIVE 09/16/2020 1011   HGBUR NEGATIVE 09/16/2020 1011   BILIRUBINUR NEGATIVE 09/16/2020 1011   BILIRUBINUR Negative 03/02/2020 1045   KETONESUR NEGATIVE 09/16/2020 1011   PROTEINUR NEGATIVE 09/16/2020 1011   NITRITE NEGATIVE 09/16/2020 1011   LEUKOCYTESUR NEGATIVE 09/16/2020 1011   Sepsis Labs: @LABRCNTIP (procalcitonin:4,lacticidven:4)  Recent Results (from the past 240 hour(s))  Resp panel by RT-PCR (RSV, Flu A&B, Covid) Anterior Nasal Swab     Status: Abnormal   Collection Time: 03/09/22  2:02 AM   Specimen: Anterior Nasal Swab  Result Value Ref Range Status   SARS Coronavirus 2 by RT PCR NEGATIVE NEGATIVE Final   Influenza A by PCR POSITIVE (A) NEGATIVE Final   Influenza B by PCR NEGATIVE NEGATIVE Final   Resp Syncytial Virus by PCR NEGATIVE NEGATIVE  Final    Comment: Performed at [facility], [address] [place], [place]  Expectorated Sputum Assessment w Gram Stain, Rflx to Resp Cult     Status: None   Collection Time: 03/10/22  4:45 AM   Specimen: Expectorated Sputum  Result Value Ref Range Status   Specimen Description EXPECTORATED SPUTUM  Final   Special Requests NONE  Final   Sputum evaluation   Final    THIS SPECIMEN IS ACCEPTABLE FOR SPUTUM CULTURE Performed at [facility], [address], [place], [place]    Report Status 03/10/2022 FINAL  Final         Radiology Studies: 03/12/2022 Venous Img Lower Bilateral (DVT)  Result Date: 03/09/2022 CLINICAL DATA:  66 year old male with positive D-dimer. EXAM: BILATERAL LOWER EXTREMITY VENOUS DOPPLER ULTRASOUND TECHNIQUE: Gray-scale sonography with graded compression, as well as color Doppler and duplex ultrasound were performed to evaluate the lower extremity deep venous systems from the level of the common femoral vein and including the common femoral, femoral, profunda femoral, popliteal and calf veins including the posterior tibial, peroneal and gastrocnemius veins when visible. The superficial great saphenous vein was also interrogated. Spectral Doppler was utilized to evaluate flow at rest and with distal augmentation maneuvers in the common femoral, femoral and popliteal veins. COMPARISON:  None Available. FINDINGS: RIGHT LOWER EXTREMITY Common Femoral Vein: No evidence of thrombus. Normal compressibility, respiratory phasicity and response to augmentation. Saphenofemoral Junction: No evidence of thrombus. Normal compressibility and flow on color Doppler imaging. Profunda Femoral Vein: No evidence of thrombus. Normal compressibility and flow on color Doppler imaging. Femoral Vein: No evidence of thrombus. Normal compressibility, respiratory phasicity and response to augmentation. Popliteal Vein: No evidence of thrombus. Normal compressibility,  respiratory phasicity and response to augmentation. Calf Veins: No evidence of thrombus. Normal compressibility and flow on color Doppler imaging. Other Findings:  None. LEFT LOWER EXTREMITY Common Femoral Vein: No evidence of thrombus. Normal compressibility, respiratory phasicity and response to augmentation. Saphenofemoral Junction: No evidence of thrombus. Normal compressibility and flow on color Doppler imaging. Profunda Femoral Vein: No evidence of thrombus. Normal compressibility and flow on color Doppler imaging. Femoral Vein: No evidence of thrombus. Normal compressibility, respiratory phasicity and response to augmentation. Popliteal Vein: No evidence of thrombus. Normal compressibility, respiratory phasicity and response to augmentation. Calf Veins: No evidence of thrombus. Normal compressibility and flow on color Doppler imaging. Other  Findings:  None. IMPRESSION: No evidence of bilateral lower extremity deep venous thrombosis. Marliss Coots, MD [facility] Radiology Electronically Signed   By: Marliss Coots M.D.   On: 03/09/2022 11:53   CT Angio Chest Pulmonary Embolism (PE) W or WO Contrast  Result Date: 03/09/2022 CLINICAL DATA:  Pulmonary embolism (PE) suspected, high prob EXAM: CT ANGIOGRAPHY CHEST WITH CONTRAST TECHNIQUE: Multidetector CT imaging of the chest was performed using the standard protocol during bolus administration of intravenous contrast. Multiplanar CT image reconstructions and MIPs were obtained to evaluate the vascular anatomy. RADIATION DOSE REDUCTION: This exam was performed according to the departmental dose-optimization program which includes automated exposure control, adjustment of the mA and/or kV according to patient size and/or use of iterative reconstruction technique. CONTRAST:  67mL OMNIPAQUE IOHEXOL 350 MG/ML SOLN COMPARISON:  CT 11/18/2020 FINDINGS: Cardiovascular: Satisfactory opacification of the pulmonary  arteries to the segmental level. No evidence of pulmonary embolism. Thoracic aorta is nonaneurysmal. Scattered atherosclerotic vascular calcifications of the aorta and coronary arteries. Normal heart size. No pericardial effusion. Mediastinum/Nodes: No enlarged mediastinal, hilar, or axillary lymph nodes. Thyroid gland, trachea, and esophagus demonstrate no significant findings. Lungs/Pleura: Stable 6.7 mm subpleural nodule at the peripheral aspect of the right upper lobe (series 6, image 24). Severe centrilobular and paraseptal emphysema with upper lobe predominance. No new pulmonary nodules or masses. No airspace consolidation. No pleural effusion or pneumothorax. Upper Abdomen: No acute abnormality. Musculoskeletal: No acute osseous abnormality. Bilateral gynecomastia. Review of the MIP images confirms the above findings. IMPRESSION: 1. No evidence of pulmonary embolism or other acute intrathoracic findings. 2. Stable 6.7 mm subpleural nodule within the right upper lobe. 3. Severe emphysema (ICD10-J43.9). 4. Aortic and coronary artery atherosclerosis (ICD10-I70.0). Electronically Signed   By: Duanne Guess D.O.   On: 03/09/2022 10:32            LOS: 1 day      Sunnie Nielsen, DO [facility] 03/10/2022, 1:33 PM    Dictation software may have been used to generate the above note. Typos may occur and escape review in typed/dictated notes. Please contact Dr Lyn Hollingshead directly for clarity if needed.  Staff may message me via secure chat in Epic  but this may not receive an immediate response,  please page me for urgent matters!  If 7PM-7AM, please contact night coverage [email or web address]

## 2022-03-10 NOTE — Progress Notes (Signed)
Chaplain responded to request for a Bible. Pt prefers large print (not available, though wife will bring from home), but he accepted a pocket Bible.  Provided emotional and spiritual support, ended with prayer.  Pt's tradition is [employer]), faith is a central part of his life.    Please contact for further support.   Belia Heman, [place] Pager:  [phone]   03/10/22 1000  Clinical Encounter Type  Visited With Patient  Visit Type Initial;Spiritual support  Referral From  [facility])  Consult/Referral To Chaplain  Spiritual Encounters  Spiritual Needs Prayer  Stress Factors  Patient Stress Factors Health changes

## 2022-03-11 MED ORDER — ACETAMINOPHEN 325 MG PO TABS: 325.0000 mg | ORAL_TABLET | Freq: Four times a day (QID) | ORAL | Status: DC | PRN

## 2022-03-11 MED ORDER — OSELTAMIVIR PHOSPHATE 75 MG PO CAPS: 75.0000 mg | ORAL_CAPSULE | Freq: Two times a day (BID) | ORAL | 0 refills | Status: AC

## 2022-03-11 MED ORDER — AZITHROMYCIN 250 MG PO TABS: 250.0000 mg | ORAL_TABLET | Freq: Every day | ORAL | 0 refills | Status: AC

## 2022-03-11 MED ORDER — DM-GUAIFENESIN ER 30-600 MG PO TB12: 1.0000 | ORAL_TABLET | Freq: Two times a day (BID) | ORAL | 0 refills | Status: DC | PRN

## 2022-03-11 MED ORDER — NEBULIZER MISC: 0 refills | Status: DC

## 2022-03-11 MED ORDER — AMLODIPINE BESYLATE 10 MG PO TABS: 10.0000 mg | ORAL_TABLET | Freq: Every day | ORAL | 0 refills | Status: DC

## 2022-03-11 MED ORDER — IPRATROPIUM-ALBUTEROL 0.5-2.5 (3) MG/3ML IN SOLN: 3.0000 mL | RESPIRATORY_TRACT | 0 refills | Status: DC | PRN

## 2022-03-11 MED ORDER — ALBUTEROL SULFATE HFA 108 (90 BASE) MCG/ACT IN AERS: 1.0000 | INHALATION_SPRAY | RESPIRATORY_TRACT | 0 refills | Status: DC | PRN

## 2022-03-11 MED ORDER — TADALAFIL 20 MG PO TABS: ORAL_TABLET | ORAL | 0 refills | Status: DC

## 2022-03-11 MED ORDER — NICOTINE 21 MG/24HR TD PT24: 21.0000 mg | MEDICATED_PATCH | Freq: Every day | TRANSDERMAL | 0 refills | Status: DC

## 2022-03-11 MED ORDER — PREDNISONE 20 MG PO TABS: 40.0000 mg | ORAL_TABLET | Freq: Every day | ORAL | 0 refills | Status: AC

## 2022-03-11 NOTE — Progress Notes (Signed)
Mobility Specialist - Progress Note   03/11/22 0953  Mobility  Activity Ambulated independently in room;Stood at bedside;Dangled on edge of bed  Level of Assistance Independent  Assistive Device None  Distance Ambulated (ft) 10 ft  Activity Response Tolerated well  Mobility Referral Yes  $Mobility charge 1 Mobility   Pt EOB on RA upon arrival. Pt STS and dons clothes and ambulates within room indep. Pt left EOB with needs in reach.   Terrilyn Saver  Mobility Specialist  03/11/22 9:54 AM

## 2022-03-11 NOTE — Discharge Summary (Signed)
Physician Discharge Summary   Patient: Michael Chaney MRN: [id number]  DOB: 1955-07-11   Admit:     Date of Admission: 03/09/2022 Admitted from: home   Discharge: Date of discharge: 03/11/22 Disposition: Home Condition at discharge: good  CODE STATUS: FULL CODE     Discharge Physician: Emeterio Reeve, DO [facility]     PCP: Pcp, No  Recommendations for Outpatient Follow-up:  Follow up with PCP 2-4 weeks Please obtain labs/tests: CBC, BMP in 2-4 weeks Please follow up on the following pending results: none   Discharge Instructions     Diet - low sodium heart healthy   Complete by: As directed    Increase activity slowly   Complete by: As directed          Discharge Diagnoses: Principal Problem:   Influenza A Active Problems:   Severe sepsis ([place])   Asthma exacerbation   Essential hypertension   HLD (hyperlipidemia)   Hypokalemia   Smoking   Lung nodule   Acute respiratory failure with hypoxia Michael Chaney)       Hospital Course: Michael Chaney is a 66 y.o. male with medical history significant of hypertension, hyperlipidemia, asthma, tobacco abuse, who presents with shortness breath x1 day which has been worsening and associated w/ yellow-colored sputum productive cough. Not on home O2 12/15: Patient was found to have oxygen desaturation to 87% on ambulation, 96% at the resting on room air.  Positive flu PCR, positive D-dimer 0.52, potassium 3.3, GFR> 60, troponin level 7 x 2, WBC 13.0, temperature normal, blood pressure 157/86, heart rate 150, 96, RR 26, chest x-ray negative.  Lower extremity venous Doppler negative for DVT and CTA negative for PE.  Patient is placed on telemetry bed for obs given was meeting sepsis criteria w/ WBC 113, HR 115, RR 26, elevated lactic acid.  12/16: WBC trending down to 10.7, Lactic acid trending down to 1.7, saturating 98% on RA 12/17: d/c home this morning   Consultants:   none  Procedures: none      ASSESSMENT & PLAN:   Principal Problem:   Influenza A Active Problems:   Severe sepsis ([place])   Asthma exacerbation   Essential hypertension   HLD (hyperlipidemia)   Hypokalemia   Smoking   Lung nodule     Influenza A and severe sepsis: Sepsis resolved   pt meets criteria for severe sepsis with WBC 13.0, heart rate of 115, RR 26. Tamiflu 75 mg twice daily --> finish course  Z-Pak empirically due to leukocytosis with WBC 13.0 --> finish course  Bronchodilators S/p IV fluid: 2.5 L normal saline, 900 cc/h Check procalcitonin level - <0.10   Asthma exacerbation: Patient states that he has history of asthma, CT shows emphysema.  Now has wheezing.  Flu A infection likely triggered asthma/COPD exacerbation. Bronchodilators Solu-Medrol 40 mg twice daily (patient received 60 mg of prednisone in ED) --> prednisone on discharge  Refilled inhalers    Essential hypertension IV hydralazine as needed Amlodipine   HLD (hyperlipidemia): Patient's not taking medications currently Follow-up with PCP   Hypokalemia:  Potassium 3.3 --> corrected Follow outpatient    Smoking Nicotine patch   Lung nodule:  CTA showed a stable 6.7 mm subpleural nodule within the right upper lobe. PCP to repeat CT-chest in 3-6 months             Discharge Instructions  Allergies as of 03/11/2022       Reactions   Bee Venom Swelling  Medication List     TAKE these medications    acetaminophen 325 MG tablet Commonly known as: TYLENOL Take 1-2 tablets (325-650 mg total) by mouth every 6 (six) hours as needed for mild pain (or Fever >/= 101).   albuterol 108 (90 Base) MCG/ACT inhaler Commonly known as: VENTOLIN HFA Inhale 1-2 puffs into the lungs every 4 (four) hours as needed for wheezing or shortness of breath. What changed: reasons to take this   amLODipine 10 MG tablet Commonly known as: NORVASC Take 1 tablet (10 mg total) by mouth  daily.   azithromycin 250 MG tablet Commonly known as: ZITHROMAX Take 1 tablet (250 mg total) by mouth daily for 3 days.   B-12 PO Take 1 tablet by mouth daily.   dextromethorphan-guaiFENesin 30-600 MG 12hr tablet Commonly known as: MUCINEX DM Take 1-2 tablets by mouth 2 (two) times daily as needed for cough.   ipratropium-albuterol 0.5-2.5 (3) MG/3ML Soln Commonly known as: DUONEB Take 3 mLs by nebulization every 4 (four) hours as needed.   Nebulizer Misc Nebulizer machine, tubing, masks refill prn x6 months   nicotine 21 mg/24hr patch Commonly known as: NICODERM CQ - dosed in mg/24 hours Place 1 patch (21 mg total) onto the skin daily.   oseltamivir 75 MG capsule Commonly known as: TAMIFLU Take 1 capsule (75 mg total) by mouth 2 (two) times daily for 3 days.   predniSONE 20 MG tablet Commonly known as: DELTASONE Take 2 tablets (40 mg total) by mouth daily for 3 days.   tadalafil 20 MG tablet Commonly known as: CIALIS Take 1 tablet by mouth 1 hour prior to intercourse          Allergies  Allergen Reactions   Bee Venom Swelling     Subjective: pt feels well this morning, SOB has resolved    Discharge Exam: BP (!) 147/84 (BP Location: Left Arm)   Pulse 84   Temp 97.8 F (36.6 C) (Oral)   Resp 18   Ht 5\' 8"  (1.727 m)   Wt 72.6 kg   SpO2 100%   BMI 24.33 kg/m  General: Pt is alert, awake, not in acute distress Cardiovascular: RRR, S1/S2 +, no rubs, no gallops Respiratory: CTA bilaterally, no wheezing, no rhonchi, diminished breath sounds bilaterally  Abdominal: Soft, NT, ND, bowel sounds + Extremities: no edema, no cyanosis     The results of significant diagnostics from this hospitalization (including imaging, microbiology, ancillary and laboratory) are listed below for reference.     Microbiology: Recent Results (from the past 240 hour(s))  Resp panel by RT-PCR (RSV, Flu A&B, Covid) Anterior Nasal Swab     Status: Abnormal   Collection Time:  03/09/22  2:02 AM   Specimen: Anterior Nasal Swab  Result Value Ref Range Status   SARS Coronavirus 2 by RT PCR NEGATIVE NEGATIVE Final   Influenza A by PCR POSITIVE (A) NEGATIVE Final   Influenza B by PCR NEGATIVE NEGATIVE Final   Resp Syncytial Virus by PCR NEGATIVE NEGATIVE Final    Comment: Performed at [facility], [address] [place], [place] [address]  Expectorated Sputum Assessment w Gram Stain, Rflx to Resp Cult     Status: None   Collection Time: 03/10/22  4:45 AM   Specimen: Expectorated Sputum  Result Value Ref Range Status   Specimen Description EXPECTORATED SPUTUM  Final   Special Requests NONE  Final   Sputum evaluation   Final    THIS SPECIMEN IS ACCEPTABLE FOR  SPUTUM CULTURE Performed at [facility], [address] [place], [place] [address]    Report Status 03/10/2022 FINAL  Final  Culture, Respiratory w Gram Stain     Status: None (Preliminary result)   Collection Time: 03/10/22  4:45 AM  Result Value Ref Range Status   Specimen Description   Final    EXPECTORATED SPUTUM Performed at [facility], [address], [place], [place] [address]    Special Requests   Final    NONE Reflexed from [id number] Performed at [facility], [address] [place], [place] [address]    Gram Stain   Final    RARE WBC PRESENT, PREDOMINANTLY PMN MODERATE GRAM NEGATIVE RODS MODERATE GRAM POSITIVE COCCI IN PAIRS IN CLUSTERS RARE GRAM POSITIVE RODS Performed at [facility], [place] [address], [place], [place] [address]    Culture PENDING  Incomplete   Report Status PENDING  Incomplete     Labs: BNP (last 3 results) No results for input(s): "BNP" in the last 8760 hours. Basic Metabolic Panel: Recent Labs  Lab 03/08/22 2246 03/09/22 0100 03/10/22 0414  NA 137  --  139  K 3.3*  --  4.0  CL 105  --  108  CO2 20*  --  23  GLUCOSE 142*  --  110*  BUN 15  --  13  CREATININE 0.86  --  0.85  CALCIUM 9.4  --  9.5  MG  --   2.0  --    Liver Function Tests: No results for input(s): "AST", "ALT", "ALKPHOS", "BILITOT", "PROT", "ALBUMIN" in the last 168 hours. No results for input(s): "LIPASE", "AMYLASE" in the last 168 hours. No results for input(s): "AMMONIA" in the last 168 hours. CBC: Recent Labs  Lab 03/08/22 2246 03/10/22 0414  WBC 13.0* 10.7*  HGB 15.9 15.4  HCT 47.6 45.4  MCV 93.5 92.1  PLT 235 199   Cardiac Enzymes: No results for input(s): "CKTOTAL", "CKMB", "CKMBINDEX", "TROPONINI" in the last 168 hours. BNP: Invalid input(s): "POCBNP" CBG: No results for input(s): "GLUCAP" in the last 168 hours. D-Dimer Recent Labs    03/09/22 0202  DDIMER 0.52*   Hgb A1c No results for input(s): "HGBA1C" in the last 72 hours. Lipid Profile No results for input(s): "CHOL", "HDL", "LDLCALC", "TRIG", "CHOLHDL", "LDLDIRECT" in the last 72 hours. Thyroid function studies No results for input(s): "TSH", "T4TOTAL", "T3FREE", "THYROIDAB" in the last 72 hours.  Invalid input(s): "FREET3" Anemia work up No results for input(s): "VITAMINB12", "FOLATE", "FERRITIN", "TIBC", "IRON", "RETICCTPCT" in the last 72 hours. Urinalysis    Component Value Date/Time   COLORURINE YELLOW (A) 09/16/2020 1011   APPEARANCEUR CLOUDY (A) 09/16/2020 1011   APPEARANCEUR Turbid (A) 03/02/2020 1045   LABSPEC 1.018 09/16/2020 1011   PHURINE 6.0 09/16/2020 1011   GLUCOSEU NEGATIVE 09/16/2020 1011   HGBUR NEGATIVE 09/16/2020 [place] 09/16/2020 1011   BILIRUBINUR Negative 03/02/2020 [place] 09/16/2020 [place] 09/16/2020 1011   NITRITE NEGATIVE 09/16/2020 [place] 09/16/2020 1011   Sepsis Labs Recent Labs  Lab 03/08/22 2246 03/10/22 0414  WBC 13.0* 10.7*   Microbiology Recent Results (from the past 240 hour(s))  Resp panel by RT-PCR (RSV, Flu A&B, Covid) Anterior Nasal Swab     Status: Abnormal   Collection Time: 03/09/22  2:02 AM   Specimen:  Anterior Nasal Swab  Result Value Ref Range Status   SARS Coronavirus 2 by  RT PCR NEGATIVE NEGATIVE Final   Influenza A by PCR POSITIVE (A) NEGATIVE Final   Influenza B by PCR NEGATIVE NEGATIVE Final   Resp Syncytial Virus by PCR NEGATIVE NEGATIVE Final    Comment: Performed at [facility], [address] [place], [place] [address]  Expectorated Sputum Assessment w Gram Stain, Rflx to Resp Cult     Status: None   Collection Time: 03/10/22  4:45 AM   Specimen: Expectorated Sputum  Result Value Ref Range Status   Specimen Description EXPECTORATED SPUTUM  Final   Special Requests NONE  Final   Sputum evaluation   Final    THIS SPECIMEN IS ACCEPTABLE FOR SPUTUM CULTURE Performed at [facility], [address] [place], [place] [address]    Report Status 03/10/2022 FINAL  Final  Culture, Respiratory w Gram Stain     Status: None (Preliminary result)   Collection Time: 03/10/22  4:45 AM  Result Value Ref Range Status   Specimen Description   Final    EXPECTORATED SPUTUM Performed at [facility], [address] [place], [place] [address]    Special Requests   Final    NONE Reflexed from [id number] Performed at [facility], [address] [place], [place] [address]    Gram Stain   Final    RARE WBC PRESENT, PREDOMINANTLY PMN MODERATE GRAM NEGATIVE RODS MODERATE GRAM POSITIVE COCCI IN PAIRS IN CLUSTERS RARE GRAM POSITIVE RODS Performed at [facility], [place] [address], [place], [place] [address]    Culture PENDING  Incomplete   Report Status PENDING  Incomplete   Imaging US Venous Img Lower Bilateral (DVT)  Result Date: 03/09/2022 CLINICAL DATA:  66 year old male with positive D-dimer. EXAM: BILATERAL LOWER EXTREMITY VENOUS DOPPLER ULTRASOUND TECHNIQUE: Gray-scale sonography with graded compression, as well as color Doppler and duplex ultrasound were performed to evaluate the lower extremity deep venous systems from the level of the  common femoral vein and including the common femoral, femoral, profunda femoral, popliteal and calf veins including the posterior tibial, peroneal and gastrocnemius veins when visible. The superficial great saphenous vein was also interrogated. Spectral Doppler was utilized to evaluate flow at rest and with distal augmentation maneuvers in the common femoral, femoral and popliteal veins. COMPARISON:  None Available. FINDINGS: RIGHT LOWER EXTREMITY Common Femoral Vein: No evidence of thrombus. Normal compressibility, respiratory phasicity and response to augmentation. Saphenofemoral Junction: No evidence of thrombus. Normal compressibility and flow on color Doppler imaging. Profunda Femoral Vein: No evidence of thrombus. Normal compressibility and flow on color Doppler imaging. Femoral Vein: No evidence of thrombus. Normal compressibility, respiratory phasicity and response to augmentation. Popliteal Vein: No evidence of thrombus. Normal compressibility, respiratory phasicity and response to augmentation. Calf Veins: No evidence of thrombus. Normal compressibility and flow on color Doppler imaging. Other Findings:  None. LEFT LOWER EXTREMITY Common Femoral Vein: No evidence of thrombus. Normal compressibility, respiratory phasicity and response to augmentation. Saphenofemoral Junction: No evidence of thrombus. Normal compressibility and flow on color Doppler imaging. Profunda Femoral Vein: No evidence of thrombus. Normal compressibility and flow on color Doppler imaging. Femoral Vein: No evidence of thrombus. Normal compressibility, respiratory phasicity and response to augmentation. Popliteal Vein: No evidence of thrombus. Normal compressibility, respiratory phasicity and response to augmentation. Calf Veins: No evidence of thrombus. Normal compressibility and flow on color Doppler imaging. Other Findings:  None. IMPRESSION: No evidence of bilateral lower extremity deep venous thrombosis. Ruthann Cancer, MD [facility]  [facility] Electronically Signed   By: Marliss Coots M.D.   On: 03/09/2022 11:53   CT Angio Chest Pulmonary Embolism (PE) W or WO Contrast  Result Date: 03/09/2022 CLINICAL DATA:  Pulmonary embolism (PE) suspected, high prob EXAM: CT ANGIOGRAPHY CHEST WITH CONTRAST TECHNIQUE: Multidetector CT imaging of the chest was performed using the standard protocol during bolus administration of intravenous contrast. Multiplanar CT image reconstructions and MIPs were obtained to evaluate the vascular anatomy. RADIATION DOSE REDUCTION: This exam was performed according to the departmental dose-optimization program which includes automated exposure control, adjustment of the mA and/or kV according to patient size and/or use of iterative reconstruction technique. CONTRAST:  53mL OMNIPAQUE IOHEXOL 350 MG/ML SOLN COMPARISON:  CT 11/18/2020 FINDINGS: Cardiovascular: Satisfactory opacification of the pulmonary arteries to the segmental level. No evidence of pulmonary embolism. Thoracic aorta is nonaneurysmal. Scattered atherosclerotic vascular calcifications of the aorta and coronary arteries. Normal heart size. No pericardial effusion. Mediastinum/Nodes: No enlarged mediastinal, hilar, or axillary lymph nodes. Thyroid gland, trachea, and esophagus demonstrate no significant findings. Lungs/Pleura: Stable 6.7 mm subpleural nodule at the peripheral aspect of the right upper lobe (series 6, image 24). Severe centrilobular and paraseptal emphysema with upper lobe predominance. No new pulmonary nodules or masses. No airspace consolidation. No pleural effusion or pneumothorax. Upper Abdomen: No acute abnormality. Musculoskeletal: No acute osseous abnormality. Bilateral gynecomastia. Review of the MIP images confirms the above findings. IMPRESSION: 1. No evidence of pulmonary embolism or other acute intrathoracic findings. 2. Stable 6.7 mm subpleural nodule within the right upper  lobe. 3. Severe emphysema (ICD10-J43.9). 4. Aortic and coronary artery atherosclerosis (ICD10-I70.0). Electronically Signed   By: Duanne Guess D.O.   On: 03/09/2022 10:32      Time coordinating discharge: over 30 minutes  SIGNED:  Sunnie Nielsen DO [facility]

## 2022-03-11 NOTE — Discharge Instructions (Signed)
Medications & Home Remedies for Viral Respiratory Illness   Note: the following list assumes no pregnancy, normal liver & kidney function and no other drug interactions. Dr. Lyn Hollingshead has highlighted medications which are safe for you to use, but these may not be appropriate for everyone. Always ask a pharmacist or qualified medical provider if you have any questions!    Aches/Pains, Fever, Headache OTC Acetaminophen (Tylenol) 500 mg tablets - take max 2 tablets (1000 mg) every 6 hours (4 times per day)  OTC Ibuprofen (Motrin) 200 mg tablets - take max 4 tablets (800 mg) every 6 hours*   Sinus Congestion Prescription Atrovent as directed OTC Nasal Saline if desired to rinse OTC Oxymetolazone (Afrin, others) sparing use due to rebound congestion, NEVER use in kids OTC Phenylephrine (Sudafed) 10 mg tablets every 4 hours (or the 12-hour formulation)* OTC Diphenhydramine (Benadryl) 25 mg tablets - take max 2 tablets every 4 hours   Cough & Sore Throat Prescription cough pills or syrups as directed OTC Dextromethorphan (Robitussin, others) - cough suppressant OTC Guaifenesin (Robitussin, Mucinex, others) - expectorant (helps cough up mucus) (Dextromethorphan and Guaifenesin also come in a combination tablet/syrup) OTC Lozenges w/ Benzocaine + Menthol (Cepacol) Honey - as much as you want! Teas which "coat the throat" - look for ingredients Elm Bark, Licorice Root, Marshmallow Root   Other Prescription Oral Steroids to decrease inflammation and improve energy Prescription inhalers for asthma or COPD patients  Prescription Antibiotics/Antivirals if these are necessary for infection - take ALL, even if you're feeling better  OTC Zinc Lozenges within 24 hours of symptoms onset - mixed evidence this shortens the duration of the common cold Don't waste your money on Vitamin C or Echinacea in acute illness - it's already too late!    *Caution in patients with high blood pressure

## 2022-03-12 LAB — CULTURE, RESPIRATORY W GRAM STAIN: Culture: NORMAL

## 2022-03-20 ENCOUNTER — Ambulatory Visit: Payer: Medicaid Other | Admitting: Nurse Practitioner

## 2022-04-03 ENCOUNTER — Other Ambulatory Visit: Payer: Self-pay | Admitting: *Deleted

## 2022-04-03 DIAGNOSIS — F17210 Nicotine dependence, cigarettes, uncomplicated: Secondary | ICD-10-CM

## 2022-04-03 DIAGNOSIS — Z122 Encounter for screening for malignant neoplasm of respiratory organs: Secondary | ICD-10-CM

## 2022-04-03 DIAGNOSIS — Z87891 Personal history of nicotine dependence: Secondary | ICD-10-CM

## 2022-04-12 ENCOUNTER — Other Ambulatory Visit: Payer: Self-pay | Admitting: Nurse Practitioner

## 2022-04-12 DIAGNOSIS — N5201 Erectile dysfunction due to arterial insufficiency: Secondary | ICD-10-CM

## 2022-04-12 MED ORDER — AMLODIPINE BESYLATE 10 MG PO TABS: 10.0000 mg | ORAL_TABLET | Freq: Every day | ORAL | 0 refills | Status: DC

## 2022-04-12 MED ORDER — TADALAFIL 20 MG PO TABS: ORAL_TABLET | ORAL | 0 refills | Status: DC

## 2022-04-12 NOTE — Telephone Encounter (Signed)
Medication Refill - Medication: amLODipine (NORVASC) 10 MG tablet   tadalafil (CIALIS) 20 MG tablet   Has the patient contacted their pharmacy? No. (Agent: If no, request that the patient contact the pharmacy for the refill. If patient does not wish to contact the pharmacy document the reason why and proceed with request.) (Agent: If yes, when and what did the pharmacy advise?)  Preferred Pharmacy (with phone number or street name): [id number] - [place] [address]), [place] [[id number]]  Has the patient been seen for an appointment in the last year OR does the patient have an upcoming appointment? Yes.    Agent: Please be advised that RX refills may take up to 3 business days. We ask that you follow-up with your pharmacy.

## 2022-04-23 ENCOUNTER — Ambulatory Visit: Payer: Self-pay

## 2022-04-23 NOTE — Telephone Encounter (Signed)
  Chief Complaint: Medication refill Symptoms: none Frequency:  Pertinent Negatives: Patient denies  Disposition: [] ED /[] Urgent Care (no appt availability in office) / [] Appointment(In office/virtual)/ []  [place] [facility]/ [] Home Care/ [] Refused Recommended Disposition /[] [place] [facility]/ [x]  Follow-up with PCP Additional Notes: Pt wants refill of Cialis. Pt has not been seen at [facility] for over 1 year. Pt has upcoming NP appt at [facility]. Explained to pt that neither practice would refill this medication. Pt hung up.    Reason for Disposition  [1] Prescription refill request for NON-ESSENTIAL medicine (i.e., no harm to patient if med not taken) AND [2] triager unable to refill per department policy  Answer Assessment - Initial Assessment Questions 1. DRUG NAME: "What medicine do you need to have refilled?"     Cialis 2. REFILLS REMAINING: "How many refills are remaining?" (Note: The label on the medicine or pill bottle will show how many refills are remaining. If there are no refills remaining, then a renewal may be needed.)     none 3. EXPIRATION DATE: "What is the expiration date?" (Note: The label states when the prescription will expire, and thus can no longer be refilled.)      4. PRESCRIBING HCP: "Who prescribed it?" Reason: If prescribed by specialist, call should be referred to that group.      5. SYMPTOMS: "Do you have any symptoms?"      6. PREGNANCY: "Is there any chance that you are pregnant?" "When was your last menstrual period?"  Protocols used: Medication Refill and Renewal Call-A-AH

## 2022-04-28 ENCOUNTER — Other Ambulatory Visit: Payer: Self-pay | Admitting: Family Medicine

## 2022-04-28 DIAGNOSIS — N5201 Erectile dysfunction due to arterial insufficiency: Secondary | ICD-10-CM

## 2022-04-30 NOTE — Telephone Encounter (Signed)
Pt has np appt on 2.8.2024

## 2022-04-30 NOTE — Telephone Encounter (Signed)
Requested medication (s) are due for refill today: yes  Requested medication (s) are on the active medication list: yes  Last refill:  04/12/22  Future visit scheduled: yes  Notes to clinic:  Unable to refill per protocol, courtesy refill already given, routing for provider approval.      Requested Prescriptions  Pending Prescriptions Disp Refills   tadalafil (CIALIS) 20 MG tablet [Pharmacy Med Name: Tadalafil 20 MG Oral Tablet] 5 tablet 0    Sig: TAKE 1 TABLET BY MOUTH 1 HOUR PRIOR TO INTERCOURSE AS NEEDED     Urology: Erectile Dysfunction Agents Failed - 04/28/2022 10:28 AM      Failed - AST in normal range and within 360 days    AST  Date Value Ref Range Status  09/16/2020 22 15 - 41 U/L Final         Failed - ALT in normal range and within 360 days    ALT  Date Value Ref Range Status  09/16/2020 20 0 - 44 U/L Final         Failed - Last BP in normal range    BP Readings from Last 1 Encounters:  03/11/22 (!) 147/84         Failed - Valid encounter within last 12 months    Recent Outpatient Visits           1 year ago Essential hypertension   [place], NP       Future Appointments             In 3 days Reece Packer, Myna Hidalgo, [facility], [facility]

## 2022-05-02 NOTE — Progress Notes (Unsigned)
   There were no vitals taken for this visit.   Subjective:    Patient ID: Michael [facility], male    DOB: 1955/04/22, 67 y.o.   MRN: [id number]  HPI: Michael [facility] is a 67 y.o. male  No chief complaint on file.  Establish care: his last physical was ***.  Medical history includes ***.  Family history includes ***.  Health Maintenance ***.   HTN:  Hyperlipidemia:  Asthma:  Relevant past medical, surgical, family and social history reviewed and updated as indicated. Interim medical history since our last visit reviewed. Allergies and medications reviewed and updated.  Review of Systems  Constitutional: Negative for fever or weight change.  Respiratory: Negative for cough and shortness of breath.   Cardiovascular: Negative for chest pain or palpitations.  Gastrointestinal: Negative for abdominal pain, no bowel changes.  Musculoskeletal: Negative for gait problem or joint swelling.  Skin: Negative for rash.  Neurological: Negative for dizziness or headache.  No other specific complaints in a complete review of systems (except as listed in HPI above).      Objective:    There were no vitals taken for this visit.  Wt Readings from Last 3 Encounters:  03/08/22 160 lb (72.6 kg)  11/08/21 156 lb 15.5 oz (71.2 kg)  06/15/21 157 lb (71.2 kg)    Physical Exam  Constitutional: Patient appears well-developed and well-nourished. Obese *** No distress.  HEENT: head atraumatic, normocephalic, pupils equal and reactive to light, ears ***, neck supple, throat within normal limits Cardiovascular: Normal rate, regular rhythm and normal heart sounds.  No murmur heard. No BLE edema. Pulmonary/Chest: Effort normal and breath sounds normal. No respiratory distress. Abdominal: Soft.  There is no tenderness. Psychiatric: Patient has a normal mood and affect. behavior is normal. Judgment and thought content normal.      Assessment & Plan:   Problem List Items Addressed This Visit    None    Follow up plan: No follow-ups on file.

## 2022-05-03 ENCOUNTER — Encounter: Payer: Self-pay | Admitting: Nurse Practitioner

## 2022-05-03 ENCOUNTER — Ambulatory Visit (INDEPENDENT_AMBULATORY_CARE_PROVIDER_SITE_OTHER): Payer: Medicaid Other | Admitting: Nurse Practitioner

## 2022-05-03 ENCOUNTER — Other Ambulatory Visit: Payer: Self-pay

## 2022-05-03 VITALS — BP 138/86 | HR 83 | Temp 97.5°F | Resp 18 | Ht 70.0 in | Wt 165.9 lb

## 2022-05-03 DIAGNOSIS — Z7689 Persons encountering health services in other specified circumstances: Secondary | ICD-10-CM

## 2022-05-03 DIAGNOSIS — I700 Atherosclerosis of aorta: Secondary | ICD-10-CM | POA: Insufficient documentation

## 2022-05-03 DIAGNOSIS — Z1159 Encounter for screening for other viral diseases: Secondary | ICD-10-CM

## 2022-05-03 DIAGNOSIS — R911 Solitary pulmonary nodule: Secondary | ICD-10-CM

## 2022-05-03 DIAGNOSIS — J439 Emphysema, unspecified: Secondary | ICD-10-CM

## 2022-05-03 DIAGNOSIS — E782 Mixed hyperlipidemia: Secondary | ICD-10-CM | POA: Diagnosis not present

## 2022-05-03 DIAGNOSIS — R7303 Prediabetes: Secondary | ICD-10-CM | POA: Diagnosis not present

## 2022-05-03 DIAGNOSIS — J4520 Mild intermittent asthma, uncomplicated: Secondary | ICD-10-CM | POA: Diagnosis not present

## 2022-05-03 DIAGNOSIS — F17200 Nicotine dependence, unspecified, uncomplicated: Secondary | ICD-10-CM

## 2022-05-03 DIAGNOSIS — N529 Male erectile dysfunction, unspecified: Secondary | ICD-10-CM | POA: Diagnosis not present

## 2022-05-03 DIAGNOSIS — Z125 Encounter for screening for malignant neoplasm of prostate: Secondary | ICD-10-CM | POA: Diagnosis not present

## 2022-05-03 DIAGNOSIS — I10 Essential (primary) hypertension: Secondary | ICD-10-CM | POA: Diagnosis not present

## 2022-05-03 DIAGNOSIS — N5201 Erectile dysfunction due to arterial insufficiency: Secondary | ICD-10-CM

## 2022-05-03 MED ORDER — AMLODIPINE BESYLATE 10 MG PO TABS: 10.0000 mg | ORAL_TABLET | Freq: Every day | ORAL | 1 refills | Status: DC

## 2022-05-03 MED ORDER — TADALAFIL 20 MG PO TABS: ORAL_TABLET | ORAL | 0 refills | Status: DC

## 2022-05-03 NOTE — Assessment & Plan Note (Signed)
Will get labs.  Patient currently not on cholesterol lowering medication.

## 2022-05-03 NOTE — Assessment & Plan Note (Signed)
Reports he smokes about half a pack a day.  Patient does not desire to quit at this time.  He has tried quitting in the past.  He is due for his lung cancer screening.  Order has already been placed.  Patient is waiting on scheduling.  Uses albuterol inhaler as needed.

## 2022-05-03 NOTE — Assessment & Plan Note (Signed)
Continue taking amlodipine 10 mg daily.  Make sure you are compliant with medication he is taking it every day.

## 2022-05-03 NOTE — Assessment & Plan Note (Signed)
Currently uses albuterol inhaler as needed

## 2022-05-03 NOTE — Assessment & Plan Note (Signed)
.    Getting labs 

## 2022-05-03 NOTE — Assessment & Plan Note (Signed)
Schedule CPE

## 2022-05-03 NOTE — Assessment & Plan Note (Signed)
Continue using Cialis as needed for erectile dysfunction.

## 2022-05-03 NOTE — Assessment & Plan Note (Signed)
Reports he smokes about half a pack a day.  Patient does not desire to quit at this time.  He has tried quitting in the past.  He is due for his lung cancer screening.  Order has already been placed.  Patient is waiting on scheduling.

## 2022-05-03 NOTE — Assessment & Plan Note (Signed)
Reports he smokes about half a pack a day.  Patient does not desire to quit at this time.  He has tried quitting in the past.  He is due for his lung cancer screening.  Order has already been placed.  Patient is waiting on scheduling. 

## 2022-05-03 NOTE — Assessment & Plan Note (Signed)
Will get labs.  Patient currently not on cholesterol lowering medication. 

## 2022-05-04 LAB — COMPLETE METABOLIC PANEL WITH GFR
AG Ratio: 1.7 (calc) (ref 1.0–2.5)
ALT: 17 U/L (ref 9–46)
AST: 27 U/L (ref 10–35)
Albumin: 4.7 g/dL (ref 3.6–5.1)
Alkaline phosphatase (APISO): 70 U/L (ref 35–144)
BUN: 14 mg/dL (ref 7–25)
CO2: 26 mmol/L (ref 20–32)
Calcium: 10 mg/dL (ref 8.6–10.3)
Chloride: 106 mmol/L (ref 98–110)
Creat: 0.85 mg/dL (ref 0.70–1.35)
Globulin: 2.8 g/dL (calc) (ref 1.9–3.7)
Glucose, Bld: 93 mg/dL (ref 65–99)
Potassium: 4.2 mmol/L (ref 3.5–5.3)
Sodium: 141 mmol/L (ref 135–146)
Total Bilirubin: 0.4 mg/dL (ref 0.2–1.2)
Total Protein: 7.5 g/dL (ref 6.1–8.1)
eGFR: 95 mL/min/{1.73_m2} (ref >=60)

## 2022-05-04 LAB — CBC WITH DIFFERENTIAL/PLATELET
Absolute Monocytes: 790 cells/uL (ref 200–950)
Basophils Absolute: 73 cells/uL (ref 0–200)
Basophils Relative: 0.7 %
Eosinophils Absolute: 198 cells/uL (ref 15–500)
Eosinophils Relative: 1.9 %
HCT: 45.9 % (ref 38.5–50.0)
Hemoglobin: 15.9 g/dL (ref 13.2–17.1)
Lymphs Abs: 1643 cells/uL (ref 850–3900)
MCH: 32 pg (ref 27.0–33.0)
MCHC: 34.6 g/dL (ref 32.0–36.0)
MCV: 92.4 fL (ref 80.0–100.0)
MPV: 9.6 fL (ref 7.5–12.5)
Monocytes Relative: 7.6 %
Neutro Abs: 7696 cells/uL (ref 1500–7800)
Neutrophils Relative %: 74 %
Platelets: 248 10*3/uL (ref 140–400)
RBC: 4.97 10*6/uL (ref 4.20–5.80)
RDW: 12.8 % (ref 11.0–15.0)
Total Lymphocyte: 15.8 %
WBC: 10.4 10*3/uL (ref 3.8–10.8)

## 2022-05-04 LAB — LIPID PANEL
Cholesterol: 173 mg/dL (ref <200)
HDL: 68 mg/dL (ref >=40)
LDL Cholesterol (Calc): 85 mg/dL (calc)
Non-HDL Cholesterol (Calc): 105 mg/dL (calc) (ref <130)
Total CHOL/HDL Ratio: 2.5 (calc) (ref <5.0)
Triglycerides: 106 mg/dL (ref <150)

## 2022-05-04 LAB — PSA: PSA: 2.51 ng/mL (ref <=4.00)

## 2022-05-04 LAB — HEMOGLOBIN A1C
Hgb A1c MFr Bld: 6 % of total Hgb — ABNORMAL HIGH (ref <5.7)
Mean Plasma Glucose: 126 mg/dL
eAG (mmol/L): 7 mmol/L

## 2022-05-04 LAB — HEPATITIS C ANTIBODY: Hepatitis C Ab: NONREACTIVE

## 2022-05-10 ENCOUNTER — Other Ambulatory Visit: Payer: Self-pay | Admitting: Nurse Practitioner

## 2022-05-10 DIAGNOSIS — N5201 Erectile dysfunction due to arterial insufficiency: Secondary | ICD-10-CM

## 2022-05-10 NOTE — Telephone Encounter (Signed)
Last RF 05/03/22 # 20 too soon  Requested Prescriptions  Refused Prescriptions Disp Refills   tadalafil (CIALIS) 20 MG tablet [Pharmacy Med Name: Tadalafil 20 MG Oral Tablet] 20 tablet 0    Sig: TAKE 1 TABLET BY MOUTH 1 HOUR PRIOR TO INTERCOURSE AS NEEDED     Urology: Erectile Dysfunction Agents Passed - 05/10/2022  9:24 AM      Passed - AST in normal range and within 360 days    AST  Date Value Ref Range Status  05/03/2022 27 10 - 35 U/L Final         Passed - ALT in normal range and within 360 days    ALT  Date Value Ref Range Status  05/03/2022 17 9 - 46 U/L Final         Passed - Last BP in normal range    BP Readings from Last 1 Encounters:  05/03/22 138/86         Passed - Valid encounter within last 12 months    Recent Outpatient Visits           1 week ago Essential hypertension   Cl[place], Julie F, FNP   1 year ago Essential hypertension   [place], NP       Future Appointments             In 4 months Reece Packer, Myna Hidalgo, [facility], [facility]

## 2022-05-11 ENCOUNTER — Ambulatory Visit: Payer: Self-pay | Admitting: *Deleted

## 2022-05-11 NOTE — Telephone Encounter (Signed)
Summary: Drug test advice   Pt is calling to ask can tadalafil (CIALIS) 20 MG tablet [id number] & amLODipine (NORVASC) 10 MG tablet [id number] cause him to fail a drug test? Please advise     Patient states he failed a drug test- THC was positive.  Patient advised typically THC is detectable in urine for 1 day to a month or longer depending on how often used.    Patient also wants to know if  he can get increased dosing on tadalafil- he states 47m is not enough Reason for Disposition . [1] Caller has NON-URGENT medicine question about med that PCP prescribed AND [2] triager unable to answer question  Answer Assessment - Initial Assessment Questions 1. NAME of MEDICINE: "What medicine(s) are you calling about?"     Tadalafil 20 mg 2. QUESTION: "What is your question?" (e.g., double dose of medicine, side effect)     Can patient increase dose- he states 20 mg is not enough 3. PRESCRIBER: "Who prescribed the medicine?" Reason: if prescribed by specialist, call should be referred to that group.     PCP Patient states he has increased dose to 40 mg and it worked better. Can he get Rx for increased dosing?  Protocols used: Medication Question Call-A-AH

## 2022-05-11 NOTE — Telephone Encounter (Signed)
FYI

## 2022-05-11 NOTE — Telephone Encounter (Signed)
  Chief Complaint: medication question- can patient get increased dosing on Tadalafil Symptoms: erectile dysfunction   Disposition: []$ ED /[]$ Urgent Care (no appt availability in office) / []$ Appointment(In office/virtual)/ []$  [facility]/ []$ Home Care/ []$ Refused Recommended Disposition /[]$ [facility]/ [x]$  Follow-up with PCP Additional Notes: Patient wants increased dosing on Tadalafil. Patient is also requesting call from insurance/LCSW to discuss his insurance

## 2022-05-11 NOTE — Telephone Encounter (Signed)
Patient notified

## 2022-05-28 ENCOUNTER — Other Ambulatory Visit: Payer: Self-pay | Admitting: Nurse Practitioner

## 2022-05-28 DIAGNOSIS — N5201 Erectile dysfunction due to arterial insufficiency: Secondary | ICD-10-CM

## 2022-05-28 NOTE — Telephone Encounter (Signed)
Medication- tadalafil (CIALIS) 20 MG tablet [id number]

## 2022-05-28 NOTE — Telephone Encounter (Signed)
Medication Refill - Medication:   Has the patient contacted their pharmacy? No. (Agent: If no, request that the patient contact the pharmacy for the refill. If patient does not wish to contact the pharmacy document the reason why and proceed with request.) (Agent: If yes, when and what did the pharmacy advise?)  Preferred Pharmacy (with phone number or street name): [place] (N), [place] [address] [place], [place] [address] Phone: [phone]  Fax: [phone]   Has the patient been seen for an appointment in the last year OR does the patient have an upcoming appointment? Yes.    Agent: Please be advised that RX refills may take up to 3 business days. We ask that you follow-up with your pharmacy.

## 2022-05-29 MED ORDER — TADALAFIL 20 MG PO TABS: ORAL_TABLET | ORAL | 0 refills | Status: DC

## 2022-05-29 NOTE — Telephone Encounter (Signed)
Requested Prescriptions  Pending Prescriptions Disp Refills   tadalafil (CIALIS) 20 MG tablet 20 tablet 0    Sig: Take 1 tablet by mouth 1 hour prior to intercourse     Urology: Erectile Dysfunction Agents Passed - 05/28/2022 12:20 PM      Passed - AST in normal range and within 360 days    AST  Date Value Ref Range Status  05/03/2022 27 10 - 35 U/L Final         Passed - ALT in normal range and within 360 days    ALT  Date Value Ref Range Status  05/03/2022 17 9 - 46 U/L Final         Passed - Last BP in normal range    BP Readings from Last 1 Encounters:  05/03/22 138/86         Passed - Valid encounter within last 12 months    Recent Outpatient Visits           3 weeks ago Essential hypertension   [place], Julie F, FNP   1 year ago Essential hypertension   [place], NP       Future Appointments             In 3 months Reece Packer, Myna Hidalgo, [facility], [facility]

## 2022-06-05 ENCOUNTER — Other Ambulatory Visit: Payer: Self-pay | Admitting: Nurse Practitioner

## 2022-06-05 NOTE — Telephone Encounter (Signed)
  Medication Refill - Medication: albuterol (VENTOLIN HFA)  Has the patient contacted their pharmacy? No.  Preferred Pharmacy (with phone number or street name):  [place] (N), [place] [address]   Has the patient been seen for an appointment in the last year OR does the patient have an upcoming appointment? Yes.    Patient was prescribed medication at discharge on 03/10/23. Please f/u with patient.

## 2022-06-05 NOTE — Telephone Encounter (Signed)
Requested medication (s) are due for refill today:   Yes  Requested medication (s) are on the active medication list:   Yes  Future visit scheduled:   Yes 09/10/2022 with Serafina Royals   Last ordered: 03/11/2022 18 g, 0 refills  Returned because prescribed by a historic provider from hospital visit 03/11/2022  Requested Prescriptions  Pending Prescriptions Disp Refills   albuterol (VENTOLIN HFA) 108 (90 Base) MCG/ACT inhaler 18 g 0    Sig: Inhale 1-2 puffs into the lungs every 4 (four) hours as needed for wheezing or shortness of breath.     Pulmonology:  Beta Agonists 2 Passed - 06/05/2022 10:19 AM      Passed - Last BP in normal range    BP Readings from Last 1 Encounters:  05/03/22 138/86         Passed - Last Heart Rate in normal range    Pulse Readings from Last 1 Encounters:  05/03/22 83         Passed - Valid encounter within last 12 months    Recent Outpatient Visits           1 month ago Essential hypertension   [facility] Bo Merino, FNP   1 year ago Essential hypertension   [place] Jon Billings, NP       Future Appointments             In 3 months Reece Packer, Myna Hidalgo, [facility], [facility]

## 2022-06-06 ENCOUNTER — Other Ambulatory Visit: Payer: Self-pay

## 2022-06-10 DIAGNOSIS — F17210 Nicotine dependence, cigarettes, uncomplicated: Secondary | ICD-10-CM | POA: Diagnosis not present

## 2022-06-10 DIAGNOSIS — R0600 Dyspnea, unspecified: Secondary | ICD-10-CM | POA: Diagnosis not present

## 2022-06-10 DIAGNOSIS — I110 Hypertensive heart disease with heart failure: Secondary | ICD-10-CM | POA: Diagnosis not present

## 2022-06-10 DIAGNOSIS — J45901 Unspecified asthma with (acute) exacerbation: Secondary | ICD-10-CM | POA: Diagnosis not present

## 2022-06-10 DIAGNOSIS — Z20822 Contact with and (suspected) exposure to covid-19: Secondary | ICD-10-CM | POA: Diagnosis not present

## 2022-06-10 DIAGNOSIS — I509 Heart failure, unspecified: Secondary | ICD-10-CM | POA: Diagnosis not present

## 2022-06-10 DIAGNOSIS — Z91018 Allergy to other foods: Secondary | ICD-10-CM | POA: Diagnosis not present

## 2022-06-10 DIAGNOSIS — E785 Hyperlipidemia, unspecified: Secondary | ICD-10-CM | POA: Diagnosis not present

## 2022-06-10 DIAGNOSIS — R093 Abnormal sputum: Secondary | ICD-10-CM | POA: Diagnosis not present

## 2022-06-16 IMAGING — CT CT CHEST LUNG CANCER SCREENING LOW DOSE W/O CM
2 of 5 series · 15 of 40 positions shown, 18 images · non-contrast
Comparison: None.

CLINICAL DATA: 64-year-old male with 47 pack-year history of
smoking. Lung cancer screening.

EXAM:
CT CHEST WITHOUT CONTRAST LOW-DOSE FOR LUNG CANCER SCREENING
TECHNIQUE: Multidetector CT imaging of the chest was performed following the
standard protocol without IV contrast.

[Series 3: lung 1.00 · axial · 0.66mm/px · z∈[-1386,-1048]mm · 12 of 374 slices shown, 15 images]
[im 18/374  mediastinal]
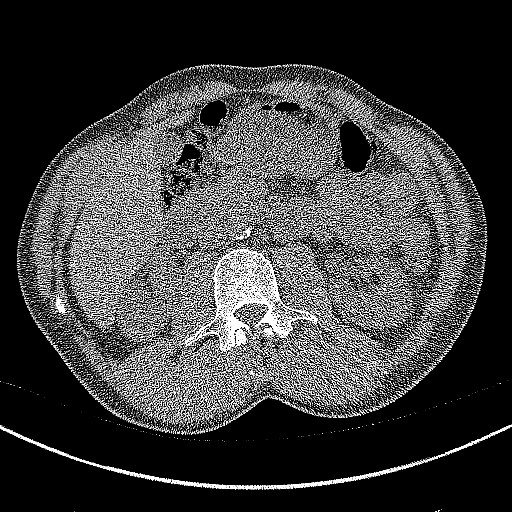
[im 18/374  lung]
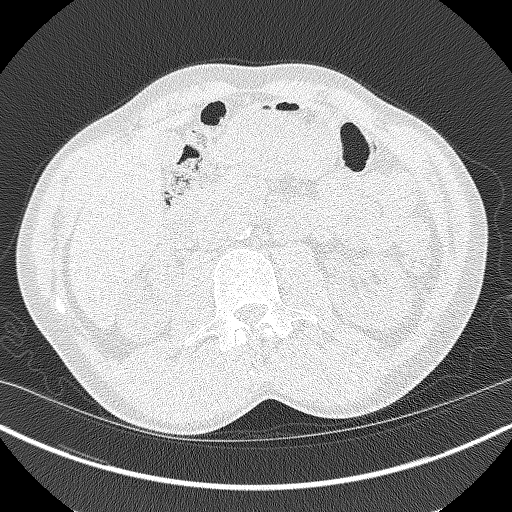
[im 54/374  lung]
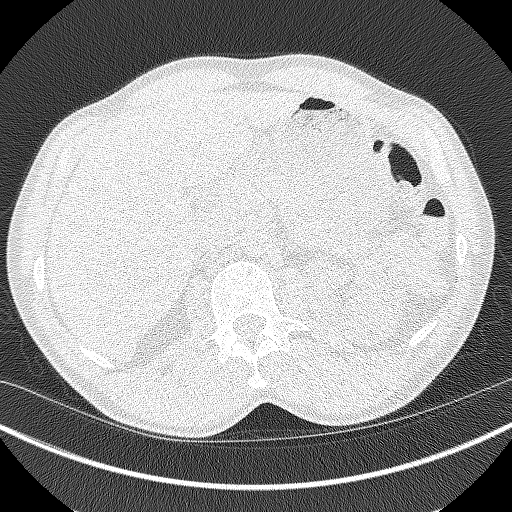
[im 89/374  lung]
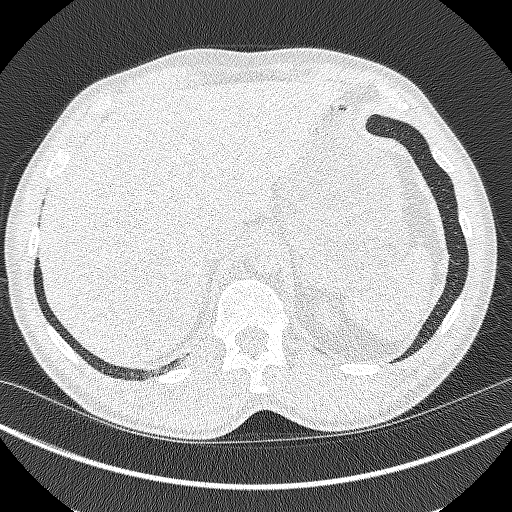
[im 107/374  lung]
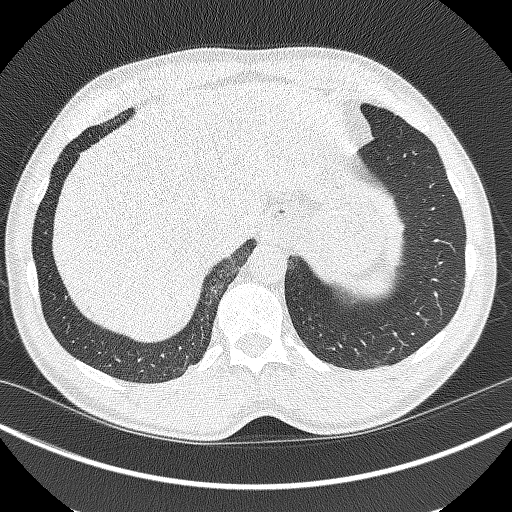
[im 143/374  mediastinal]
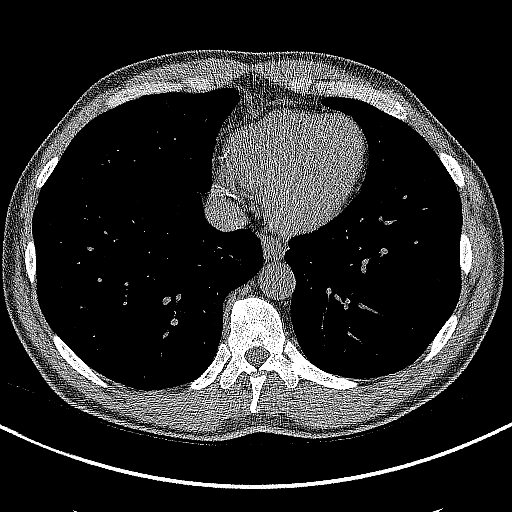
[im 143/374  lung]
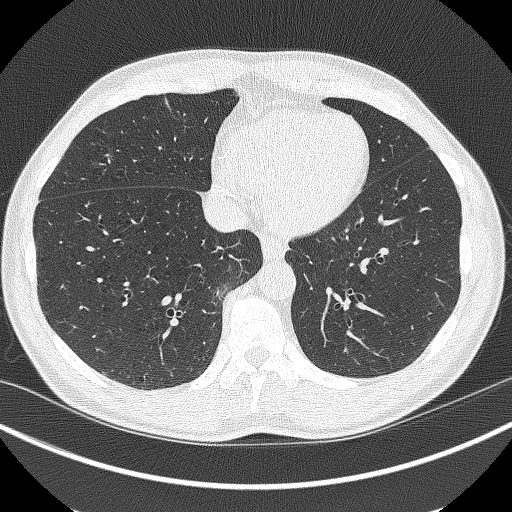
[im 178/374  lung]
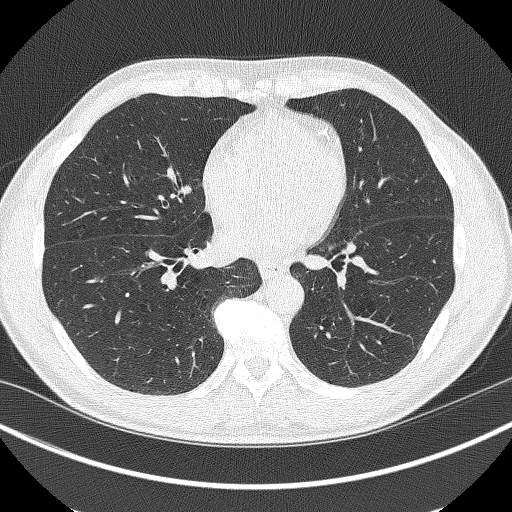
[im 196/374  lung]
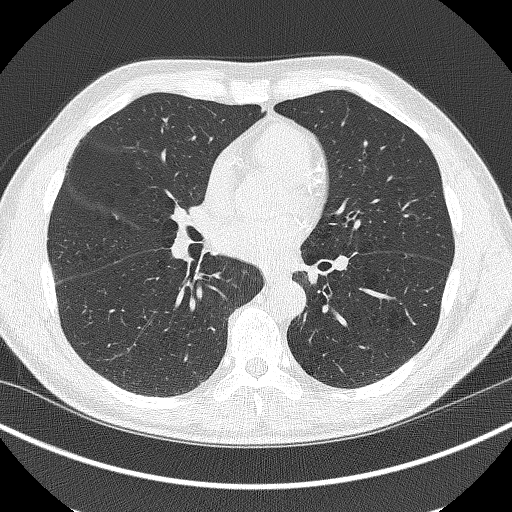
[im 231/374  lung]
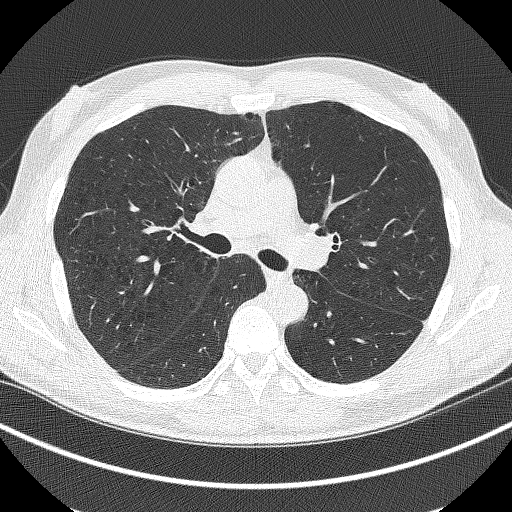
[im 267/374  mediastinal]
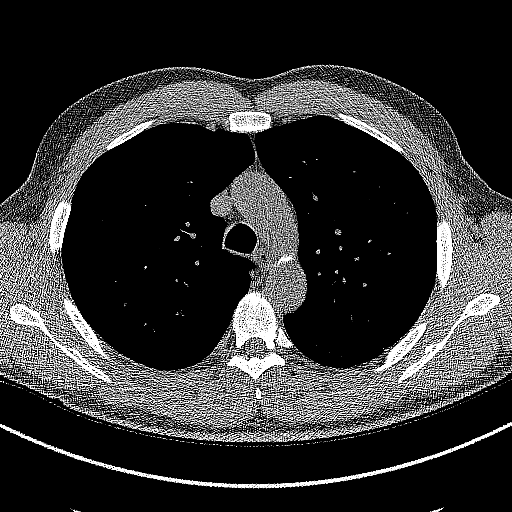
[im 267/374  lung]
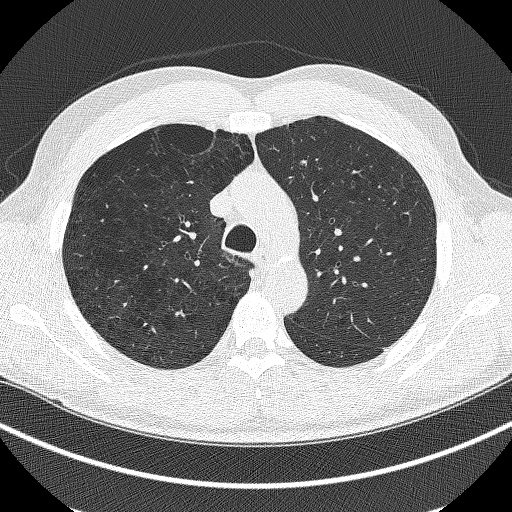
[im 285/374  lung]
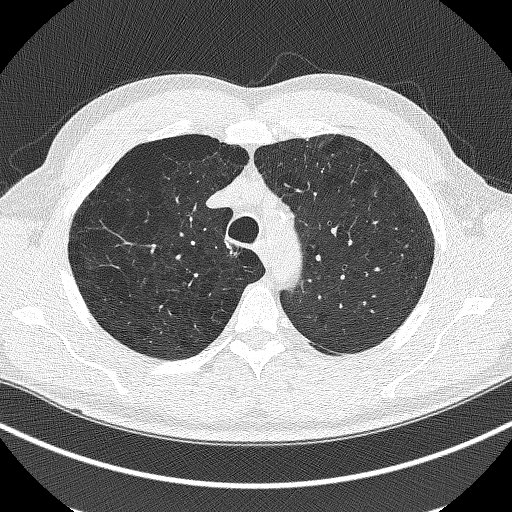
[im 320/374  lung]
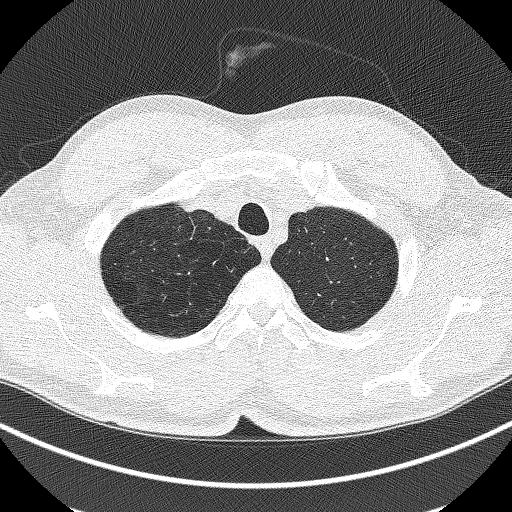
[im 356/374  lung]
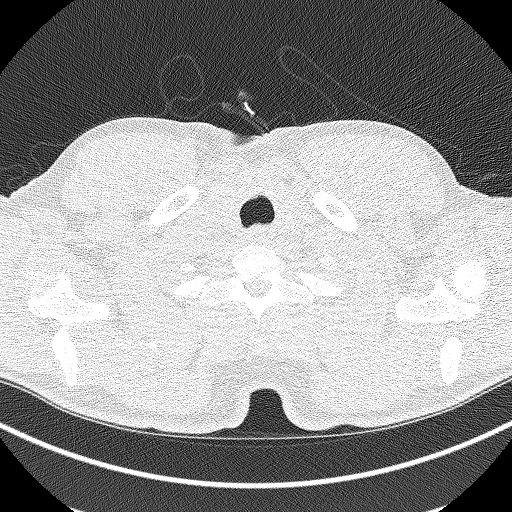

[Series 4: coronals lung 1.00 cor · coronal · 0.66mm/px · 3 of 263 slices shown]
[im 53/263  lung]
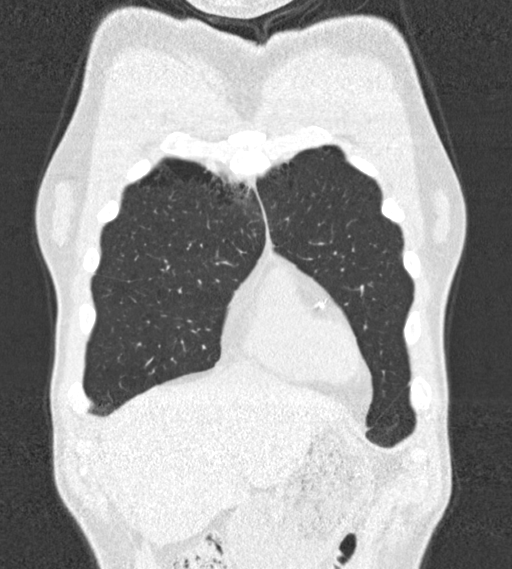
[im 105/263  lung]
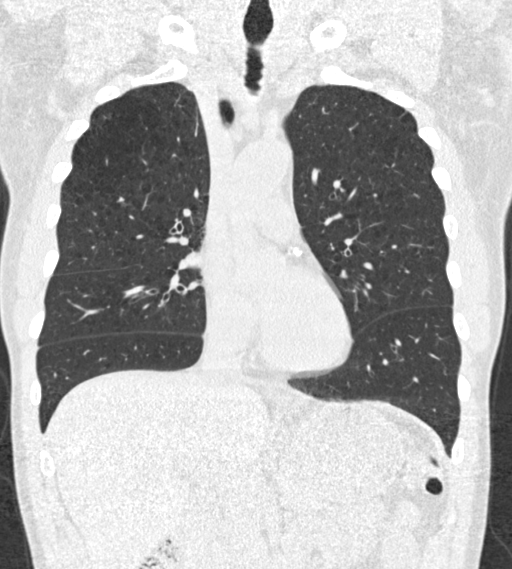
[im 158/263  lung]
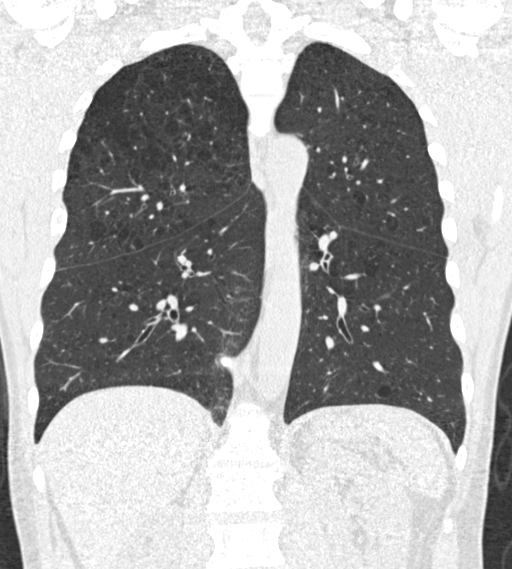

[15 of 40 positions shown; findings below may reference images not displayed]

FINDINGS: Cardiovascular: The heart size is normal. No substantial pericardial
effusion. Coronary artery calcification is evident. Atherosclerotic
calcification is noted in the wall of the thoracic aorta.

Mediastinum/Nodes: No mediastinal lymphadenopathy. No evidence for
gross hilar lymphadenopathy although assessment is limited by the
lack of intravenous contrast on today's study. The esophagus has
normal imaging features. There is no axillary lymphadenopathy.

Lungs/Pleura: Centrilobular and paraseptal emphysema evident. Tiny
bilateral pulmonary nodules are identified, some of which are
calcified consistent with granulomata. Dominant noncalcified
pulmonary nodule is 6.6 mm in the peripheral right upper lobe (image
69). No focal airspace consolidation. No pleural effusion.

Upper Abdomen: 1.6 cm lesion upper pole left kidney approaches water
density, compatible with a cyst.

Musculoskeletal: No worrisome lytic or sclerotic osseous
abnormality. Bilateral gynecomastia noted.
IMPRESSION: 1. Lung-RADS 3, probably benign findings. Short-term follow-up in 6
months is recommended with repeat low-dose chest CT without contrast
(please use the following order, "CT CHEST LCS NODULE FOLLOW-UP W/O
CM").
2.  Emphysema (MJUEU-PBA.I) and Aortic Atherosclerosis (MJUEU-170.0)

## 2022-06-20 ENCOUNTER — Other Ambulatory Visit: Payer: Self-pay | Admitting: Nurse Practitioner

## 2022-06-20 DIAGNOSIS — N5201 Erectile dysfunction due to arterial insufficiency: Secondary | ICD-10-CM

## 2022-06-21 NOTE — Telephone Encounter (Signed)
Requested Prescriptions  Pending Prescriptions Disp Refills   tadalafil (CIALIS) 20 MG tablet [Pharmacy Med Name: Tadalafil 20 MG Oral Tablet] 20 tablet 0    Sig: TAKE 1 TABLET BY MOUTH 1 HOUR PRIOR TO INTERCOURSE     Urology: Erectile Dysfunction Agents Passed - 06/20/2022 11:56 AM      Passed - AST in normal range and within 360 days    AST  Date Value Ref Range Status  05/03/2022 27 10 - 35 U/L Final         Passed - ALT in normal range and within 360 days    ALT  Date Value Ref Range Status  05/03/2022 17 9 - 46 U/L Final         Passed - Last BP in normal range    BP Readings from Last 1 Encounters:  05/03/22 138/86         Passed - Valid encounter within last 12 months    Recent Outpatient Visits           1 month ago Essential hypertension   [place], Julie F, FNP   1 year ago Essential hypertension   [place], NP       Future Appointments             In 2 months Reece Packer, Myna Hidalgo, [facility], [facility]

## 2022-07-12 ENCOUNTER — Other Ambulatory Visit: Payer: Self-pay | Admitting: Nurse Practitioner

## 2022-07-12 DIAGNOSIS — N5201 Erectile dysfunction due to arterial insufficiency: Secondary | ICD-10-CM

## 2022-07-12 NOTE — Telephone Encounter (Signed)
Requested Prescriptions  Pending Prescriptions Disp Refills   tadalafil (CIALIS) 20 MG tablet [Pharmacy Med Name: Tadalafil 20 MG Oral Tablet] 20 tablet 1    Sig: TAKE 1 TABLET BY MOUTH 1 HOUR PRIOR TO INTERCOURSE AS NEEDED     Urology: Erectile Dysfunction Agents Passed - 07/12/2022  9:38 AM      Passed - AST in normal range and within 360 days    AST  Date Value Ref Range Status  05/03/2022 27 10 - 35 U/L Final         Passed - ALT in normal range and within 360 days    ALT  Date Value Ref Range Status  05/03/2022 17 9 - 46 U/L Final         Passed - Last BP in normal range    BP Readings from Last 1 Encounters:  05/03/22 138/86         Passed - Valid encounter within last 12 months    Recent Outpatient Visits           2 months ago Essential hypertension   [facility] [name], FNP   1 year ago Essential hypertension   [place] [facility] Larae Grooms, NP

## 2022-08-21 ENCOUNTER — Other Ambulatory Visit: Payer: Self-pay | Admitting: Nurse Practitioner

## 2022-08-21 DIAGNOSIS — N5201 Erectile dysfunction due to arterial insufficiency: Secondary | ICD-10-CM

## 2022-08-22 NOTE — Telephone Encounter (Signed)
Requested Prescriptions  Pending Prescriptions Disp Refills   tadalafil (CIALIS) 20 MG tablet [Pharmacy Med Name: Tadalafil 20 MG Oral Tablet] 20 tablet 0    Sig: TAKE ONE TABLET BY MOUTH 1 HOUR PRIOR TO INTERCOURSE AS NEEDED     Urology: Erectile Dysfunction Agents Passed - 08/21/2022  3:02 PM      Passed - AST in normal range and within 360 days    AST  Date Value Ref Range Status  05/03/2022 27 10 - 35 U/L Final         Passed - ALT in normal range and within 360 days    ALT  Date Value Ref Range Status  05/03/2022 17 9 - 46 U/L Final         Passed - Last BP in normal range    BP Readings from Last 1 Encounters:  05/03/22 138/86         Passed - Valid encounter within last 12 months    Recent Outpatient Visits           3 months ago Essential hypertension   [facility] [name], FNP   1 year ago Essential hypertension   [facility] Larae Grooms, NP       Future Appointments             In 3 weeks Zane Herald, Rudolpho Sevin, FNP [facility], [facility]

## 2022-09-09 ENCOUNTER — Other Ambulatory Visit: Payer: Self-pay | Admitting: Nurse Practitioner

## 2022-09-09 DIAGNOSIS — N5201 Erectile dysfunction due to arterial insufficiency: Secondary | ICD-10-CM

## 2022-09-10 ENCOUNTER — Encounter: Payer: Medicaid Other | Admitting: Nurse Practitioner

## 2022-09-10 NOTE — Telephone Encounter (Signed)
Requested Prescriptions  Pending Prescriptions Disp Refills   tadalafil (CIALIS) 20 MG tablet [Pharmacy Med Name: Tadalafil 20 MG Oral Tablet] 20 tablet 0    Sig: TAKE 1 TABLET BY MOUTH 1 HOUR PRIOR TO INTERCOURSE AS NEEDED     Urology: Erectile Dysfunction Agents Passed - 09/09/2022 12:08 AM      Passed - AST in normal range and within 360 days    AST  Date Value Ref Range Status  05/03/2022 27 10 - 35 U/L Final         Passed - ALT in normal range and within 360 days    ALT  Date Value Ref Range Status  05/03/2022 17 9 - 46 U/L Final         Passed - Last BP in normal range    BP Readings from Last 1 Encounters:  05/03/22 138/86         Passed - Valid encounter within last 12 months    Recent Outpatient Visits           4 months ago Essential hypertension   [facility] [name], FNP   1 year ago Essential hypertension   [place] [facility] Larae Grooms, NP       Future Appointments             In 1 week Zane Herald, Rudolpho Sevin, FNP [facility], [facility]

## 2022-09-11 NOTE — Progress Notes (Deleted)
Name: Michael Chaney   MRN: [id number]    DOB: September 28, 1955   Date:09/11/2022       Progress Note  Subjective  Chief Complaint  No chief complaint on file.   HPI  Patient presents for annual CPE ***.  IPSS Questionnaire (AUA-7): Over the past month.   1)  How often have you had a sensation of not emptying your bladder completely after you finish urinating?  {Rating:19227}  2)  How often have you had to urinate again less than two hours after you finished urinating? {Rating:19227}  3)  How often have you found you stopped and started again several times when you urinated?  {Rating:19227}  4) How difficult have you found it to postpone urination?  {Rating:19227}  5) How often have you had a weak urinary stream?  {Rating:19227}  6) How often have you had to push or strain to begin urination?  {Rating:19227}  7) How many times did you most typically get up to urinate from the time you went to bed until the time you got up in the morning?  {Rating:19228}  Total score:  0-7 mildly symptomatic   8-19 moderately symptomatic   20-35 severely symptomatic     Diet: *** Exercise: *** Last Dental Exam: **** Last Eye Exam: ***  Depression: phq 9 is {gen pos [id number]}    05/03/2022   10:18 AM 06/08/2020    9:37 AM 06/01/2020    1:37 PM  Depression screen PHQ 2/9  Decreased Interest 0 0 0  Down, Depressed, Hopeless 0 0 1  PHQ - 2 Score 0 0 1  Altered sleeping  3 3  Tired, decreased energy  0 0  Change in appetite  1 3  Feeling bad or failure about yourself   0 3  Trouble concentrating  0 2  Moving slowly or fidgety/restless  0 0  Suicidal thoughts  0 0  PHQ-9 Score  4 12  Difficult doing work/chores  Not difficult at all Very difficult    Hypertension:  BP Readings from Last 3 Encounters:  05/03/22 138/86  03/11/22 (!) 147/84  11/09/21 134/86    Obesity: Wt Readings from Last 3 Encounters:  05/03/22 165 lb 14.4 oz (75.3 kg)  03/08/22 160 lb (72.6 kg)  11/08/21 156 lb 15.5  oz (71.2 kg)   BMI Readings from Last 3 Encounters:  05/03/22 23.80 kg/m  03/08/22 24.33 kg/m  11/08/21 23.52 kg/m     Lipids:  Lab Results  Component Value Date   CHOL 173 05/03/2022   CHOL 248 (H) 03/02/2020   Lab Results  Component Value Date   HDL 68 05/03/2022   HDL 64 03/02/2020   Lab Results  Component Value Date   LDLCALC 85 05/03/2022   LDLCALC 170 (H) 03/02/2020   Lab Results  Component Value Date   TRIG 106 05/03/2022   TRIG 82 03/02/2020   Lab Results  Component Value Date   CHOLHDL 2.5 05/03/2022   CHOLHDL 3.9 03/02/2020   No results found for: "LDLDIRECT" Glucose:  Glucose, Bld  Date Value Ref Range Status  05/03/2022 93 65 - 99 mg/dL Final    Comment:    .            Fasting reference interval .   03/10/2022 110 (H) 70 - 99 mg/dL Final    Comment:    Glucose reference range applies only to samples taken after fasting for at least 8 hours.  03/08/2022 142 (H) 70 - 99  mg/dL Final    Comment:    Glucose reference range applies only to samples taken after fasting for at least 8 hours.    Flowsheet Row Office Visit from 05/03/2022 in [facility]  AUDIT-C Score 0      ***  Married STD testing and prevention (HIV/chl/gon/syphilis):  {yes/no/default n/a:21102::"not applicable"} Sexual history:  Hep C Screening:  Skin cancer: Discussed monitoring for atypical lesions Colorectal cancer: *** Prostate cancer:  {yes/no/default n/a:21102::"not applicable"} Lab Results  Component Value Date   PSA 2.51 05/03/2022     Lung cancer:  Low Dose CT Chest recommended if Age 6-80 years, 30 pack-year currently smoking OR have quit w/in 15years. Patient  {Response; is/is not/na:63} a candidate for screening   AAA: The USPSTF recommends one-time screening with ultrasonography in men ages 74 to 75 years who have ever smoked. Patient   {Response; is /is not/na:63}, a candidate for screening  ECG:  ***  Vaccines:   HPV:   Tdap:  Shingrix:  Pneumonia:  Flu:  COVID-19:  Advanced Care Planning: A voluntary discussion about advance care planning including the explanation and discussion of advance directives.  Discussed health care proxy and Living will, and the patient was able to identify a health care proxy as ***.  Patient {DOES_DOES ZOX:09604} have a living will and power of attorney of health care   Patient Active Problem List   Diagnosis Date Noted   Mild intermittent asthma without complication 05/03/2022   Aortic atherosclerosis (HCC) 05/03/2022   Pulmonary emphysema (HCC) 05/03/2022   HLD (hyperlipidemia) 03/09/2022   Lung nodule 03/09/2022   Erectile dysfunction 04/28/2020   Prediabetes 04/13/2020   Essential hypertension 03/02/2020   Smoking 03/02/2020   Encounter to establish care 03/02/2020    Past Surgical History:  Procedure Laterality Date   XI ROBOTIC ASSISTED INGUINAL HERNIA REPAIR WITH MESH Bilateral 09/28/2020   Procedure: XI ROBOTIC ASSISTED INGUINAL HERNIA REPAIR WITH MESH;  Surgeon: Carolan Shiver, MD;  Location: [facility] ORS;  Service: General;  Laterality: Bilateral;    Family History  Problem Relation Age of Onset   Hypertension Mother    Dementia Mother     Social History   Socioeconomic History   Marital status: Married    Spouse name: Not on file   Number of children: 7   Years of education: Not on file   Highest education level: Not on file  Occupational History   Not on file  Tobacco Use   Smoking status: Every Day    Packs/day: 0.75    Years: 47.00    Additional pack years: 0.00    Total pack years: 35.25    Types: Cigarettes   Smokeless tobacco: Never  Vaping Use   Vaping Use: Never used  Substance and Sexual Activity   Alcohol use: Not Currently   Drug use: Yes    Types: Marijuana   Sexual activity: Yes  Other Topics Concern   Not on file  Social History Narrative   Not on file   Social Determinants of Health   Financial Resource  Strain: Not on file  Food Insecurity: No Food Insecurity (03/09/2022)   Hunger Vital Sign    Worried About Running Out of Food in the Last Year: Never true    Ran Out of Food in the Last Year: Never true  Transportation Needs: No Transportation Needs (03/09/2022)   PRAPARE - Administrator, Civil Service (Medical): No    Lack of Transportation (Non-Medical):  No  Physical Activity: Not on file  Stress: Not on file  Social Connections: Not on file  Intimate Partner Violence: Not At Risk (03/09/2022)   Humiliation, Afraid, Rape, and Kick questionnaire    Fear of Current or Ex-Partner: No    Emotionally Abused: No    Physically Abused: No    Sexually Abused: No     Current Outpatient Medications:    albuterol (VENTOLIN HFA) 108 (90 Base) MCG/ACT inhaler, Inhale 1-2 puffs into the lungs every 4 (four) hours as needed for wheezing or shortness of breath., Disp: 18 g, Rfl: 0   amLODipine (NORVASC) 10 MG tablet, Take 1 tablet (10 mg total) by mouth daily., Disp: 90 tablet, Rfl: 1   tadalafil (CIALIS) 20 MG tablet, TAKE 1 TABLET BY MOUTH 1 HOUR PRIOR TO INTERCOURSE AS NEEDED, Disp: 20 tablet, Rfl: 0  Allergies  Allergen Reactions   Bee Venom Swelling     ROS  ***   Objective  There were no vitals filed for this visit.  There is no height or weight on file to calculate BMI.  Physical Exam ***  No results found for this or any previous visit (from the past 2160 hour(s)).   Fall Risk:    05/03/2022   10:18 AM  Fall Risk   Falls in the past year? 0  Number falls in past yr: 0  Injury with Fall? 0     Functional Status Survey:      Assessment & Plan  There are no diagnoses linked to this encounter.   -Prostate cancer screening and PSA options (with potential risks and benefits of testing vs not testing) were discussed along with recent recs/guidelines. -USPSTF grade A and B recommendations reviewed with patient; age-appropriate recommendations,  preventive care, screening tests, etc discussed and encouraged; healthy living encouraged; see AVS for patient education given to patient -Discussed importance of 150 minutes of physical activity weekly, eat two servings of fish weekly, eat one serving of tree nuts ( cashews, pistachios, pecans, almonds.Marland Kitchen) every other day, eat 6 servings of fruit/vegetables daily and drink plenty of water and avoid sweet beverages.  -Reviewed Health Maintenance: {yes/no/default n/a:21102::"not applicable"}

## 2022-09-18 ENCOUNTER — Encounter: Payer: Medicaid Other | Admitting: Nurse Practitioner

## 2022-09-24 DIAGNOSIS — M5412 Radiculopathy, cervical region: Secondary | ICD-10-CM | POA: Diagnosis not present

## 2022-09-24 DIAGNOSIS — M25511 Pain in right shoulder: Secondary | ICD-10-CM | POA: Diagnosis not present

## 2022-10-02 ENCOUNTER — Other Ambulatory Visit: Payer: Self-pay | Admitting: Nurse Practitioner

## 2022-10-02 DIAGNOSIS — N5201 Erectile dysfunction due to arterial insufficiency: Secondary | ICD-10-CM

## 2022-10-02 NOTE — Telephone Encounter (Signed)
Requested Prescriptions  Pending Prescriptions Disp Refills   tadalafil (CIALIS) 20 MG tablet [Pharmacy Med Name: Tadalafil 20 MG Oral Tablet] 90 tablet 0    Sig: TAKE 1 TABLET BY MOUTH 1 HOUR PRIOR TO INTERCOURSE AS NEEDED     Urology: Erectile Dysfunction Agents Passed - 10/02/2022 11:47 AM      Passed - AST in normal range and within 360 days    AST  Date Value Ref Range Status  05/03/2022 27 10 - 35 U/L Final         Passed - ALT in normal range and within 360 days    ALT  Date Value Ref Range Status  05/03/2022 17 9 - 46 U/L Final         Passed - Last BP in normal range    BP Readings from Last 1 Encounters:  05/03/22 138/86         Passed - Valid encounter within last 12 months    Recent Outpatient Visits           5 months ago Essential hypertension   [facility] [name], FNP   1 year ago Essential hypertension   [facility] Larae Grooms, NP       Future Appointments             In 1 week Stoioff, Verna Czech, MD [facility] [place]

## 2022-10-10 ENCOUNTER — Ambulatory Visit: Payer: Medicaid Other | Admitting: Urology

## 2022-10-10 ENCOUNTER — Encounter: Payer: Self-pay | Admitting: Urology

## 2022-10-10 DIAGNOSIS — J45901 Unspecified asthma with (acute) exacerbation: Secondary | ICD-10-CM | POA: Diagnosis not present

## 2022-10-10 DIAGNOSIS — Z760 Encounter for issue of repeat prescription: Secondary | ICD-10-CM | POA: Diagnosis not present

## 2022-10-25 ENCOUNTER — Ambulatory Visit: Payer: Medicaid Other | Admitting: Urology

## 2022-10-25 ENCOUNTER — Encounter: Payer: Self-pay | Admitting: Urology

## 2022-10-25 VITALS — Ht 70.0 in | Wt 165.0 lb

## 2022-10-25 DIAGNOSIS — E291 Testicular hypofunction: Secondary | ICD-10-CM

## 2022-10-25 DIAGNOSIS — N5201 Erectile dysfunction due to arterial insufficiency: Secondary | ICD-10-CM

## 2022-10-25 NOTE — Progress Notes (Signed)
I,Michael Chaney,acting as a scribe for [name], MD.,have documented all relevant documentation on the behalf of [name], MD,as directed by  [name], MD while in the presence of [name], MD.  10/25/2022 9:31 AM   Michael Chaney Nov 15, 1955 [id number]  Referring provider: [name], FNP [address] [place],  [place] [address]  Chief Complaint  Patient presents with   Follow-up    6 month follow up     Urologic History:  1. Erectile Dysfunction Organic risk factors include hyperlipidemia, hypertension and antihypertensive medications 35 pack-year tobacco history and current smoker at 1 pack/day Trial sildenafil with improvement in erections   HPI: Michael Chaney is a 67 y.o. male presenting for follow up of erectile dysfunction.  Initially seen April 2022 with complaints of ED. Significant organic risk factors included hypertension, hyperlipidemia, antihypertensive medication, and tobacco use. He was initially given a trial of Sildenafil with improvement and was subsequently switched to Tadalafil. He also complained of difficulty achieving orgasm, decreased libido, tiredness, and fatigue. Testosterone level was low at 241 with elevated LH of 11.4. Follow up testosterone level was recommended, however, he never followed up to have this drawn. He continues to use Tadalafil. SHIM is 17/35. Still complains of tiredness and fatigue.   PMH: Past Medical History:  Diagnosis Date   Asthma    Dyspnea    Hyperlipidemia    Hypertension     Surgical History: Past Surgical History:  Procedure Laterality Date   XI ROBOTIC ASSISTED INGUINAL HERNIA REPAIR WITH MESH Bilateral 09/28/2020   Procedure: XI ROBOTIC ASSISTED INGUINAL HERNIA REPAIR WITH MESH;  Surgeon: Carolan Shiver, MD;  Location: [facility] ORS;  Service: General;  Laterality: Bilateral;    Home Medications:  Allergies as of 10/25/2022       Reactions   Bee  Venom Swelling        Medication List        Accurate as of October 25, 2022  9:31 AM. If you have any questions, ask your nurse or doctor.          albuterol 108 (90 Base) MCG/ACT inhaler Commonly known as: VENTOLIN HFA Inhale 1-2 puffs into the lungs every 4 (four) hours as needed for wheezing or shortness of breath.   amLODipine 10 MG tablet Commonly known as: NORVASC Take 1 tablet (10 mg total) by mouth daily.   tadalafil 20 MG tablet Commonly known as: CIALIS TAKE 1 TABLET BY MOUTH 1 HOUR PRIOR TO INTERCOURSE AS NEEDED        Allergies:  Allergies  Allergen Reactions   Bee Venom Swelling    Family History: Family History  Problem Relation Age of Onset   Hypertension Mother    Dementia Mother     Social History:  reports that he has been smoking cigarettes. He has a 35.3 pack-year smoking history. He has never used smokeless tobacco. He reports that he does not currently use alcohol. He reports current drug use. Drug: Marijuana.   Physical Exam: Ht 5\' 10"  (1.778 m)   Wt 165 lb (74.8 kg)   BMI 23.68 kg/m   Constitutional:  Alert and oriented, No acute distress. HEENT: [place] AT Respiratory: Normal respiratory effort, no increased work of breathing. Psychiatric: Normal mood and affect.   Assessment & Plan:    1. Hypogonadism Symptoms: decreased libido, ED, tiredness, and fatigue. Repeat testosterone level this morning. We discussed the diagnosis of testosterone is based on 2 abnormal  total testosterone levels drawn in the a.m. admitted with signs and symptoms of low testosterone. We discussed various forms of testosterone placement including topical preparations, intramuscular injections, subcutaneous injections, subcutaneous pellet implantation and oral testosterone.  Pros and cons of each form were discussed.  The risk of transference of topical testosterone. I had an extensive discussion regarding testosterone replacement therapy including the  following: Treatment may result in improvements in erectile function, low sex drive, anemia, bone mineral density, lean body mass, and depressive symptoms; evidence is inconclusive whether testosterone therapy improves cognitive function, measures of diabetes, energy, fatigue, lipid profiles, and quality of life measures; there is no conclusive evidence linking testosterone therapy to the development of prostate cancer; there is no definitive evidence linking testosterone therapy to a higher incidence of venothrombolic events; at the present time it cannot be stated definitively whether testosterone therapy increases or decreases the risk of cardiovascular events including myocardial infarction and stroke. Potential side effects were discussed including erythrocytosis, gynecomastia.  The need for regular monitoring of testosterone levels and hematocrit was discussed. He would prefer a topical gel.   2.  Erectile dysfunction We discussed 70% of erectile dysfunction is secondary to arterial insufficiency and unlikely testosterone replacement will resolve his ED, however, it may improve the efficacy of PDE5 inhibitors.  I have reviewed the above documentation for accuracy and completeness, and I agree with the above.   [name], MD  [facility] [address] [place], [place] [address] [phone]

## 2022-10-26 ENCOUNTER — Other Ambulatory Visit: Payer: Self-pay | Admitting: Urology

## 2022-10-26 ENCOUNTER — Other Ambulatory Visit: Payer: Self-pay

## 2022-10-26 DIAGNOSIS — E291 Testicular hypofunction: Secondary | ICD-10-CM

## 2022-10-26 MED ORDER — TESTOSTERONE 20.25 MG/ACT (1.62%) TD GEL
TRANSDERMAL | 1 refills | Status: DC
  Filled 2022-10-26: qty 75, 30d supply, fill #0

## 2022-10-26 NOTE — Progress Notes (Unsigned)
Name: Michael Chaney   MRN: [id number]    DOB: 1955-08-21   Date:10/30/2022       Progress Note  Subjective  Chief Complaint  Chief Complaint  Patient presents with   Annual Exam    HPI  Patient presents for annual CPE .  IPSS Questionnaire (AUA-7):he is established with urology Over the past month.   1)  How often have you had a sensation of not emptying your bladder completely after you finish urinating?  4-More than half the time  2)  How often have you had to urinate again less than two hours after you finished urinating? 1 - Less than 1 time in 5  3)  How often have you found you stopped and started again several times when you urinated?  4 - More than half the time  4) How difficult have you found it to postpone urination?  0 - Not at all  5) How often have you had a weak urinary stream?  2 - Less than half the time  6) How often have you had to push or strain to begin urination?  0 - Not at all  7) How many times did you most typically get up to urinate from the time you went to bed until the time you got up in the morning?  1 - 1 time  Total score:  0-7 mildly symptomatic   8-19 moderately symptomatic   20-35 severely symptomatic     Diet: Regular, he eats sandwiches Exercise: None, physical job Last Dental Exam: Appointment 8/16 Last Eye Exam: 2 years  Depression: phq 9 is negative    10/30/2022    7:58 AM 05/03/2022   10:18 AM 06/08/2020    9:37 AM 06/01/2020    1:37 PM  Depression screen PHQ 2/9  Decreased Interest 0 0 0 0  Down, Depressed, Hopeless 0 0 0 1  PHQ - 2 Score 0 0 0 1  Altered sleeping   3 3  Tired, decreased energy   0 0  Change in appetite   1 3  Feeling bad or failure about yourself    0 3  Trouble concentrating   0 2  Moving slowly or fidgety/restless   0 0  Suicidal thoughts   0 0  PHQ-9 Score   4 12  Difficult doing work/chores   Not difficult at all Very difficult    Hypertension:  BP Readings from Last 3 Encounters:  10/30/22 124/82   05/03/22 138/86  03/11/22 (!) 147/84    Obesity: Wt Readings from Last 3 Encounters:  10/30/22 160 lb 4.8 oz (72.7 kg)  10/25/22 165 lb (74.8 kg)  05/03/22 165 lb 14.4 oz (75.3 kg)   BMI Readings from Last 3 Encounters:  10/30/22 23.00 kg/m  10/25/22 23.68 kg/m  05/03/22 23.80 kg/m     Lipids:  Lab Results  Component Value Date   CHOL 173 05/03/2022   CHOL 248 (H) 03/02/2020   Lab Results  Component Value Date   HDL 68 05/03/2022   HDL 64 03/02/2020   Lab Results  Component Value Date   LDLCALC 85 05/03/2022   LDLCALC 170 (H) 03/02/2020   Lab Results  Component Value Date   TRIG 106 05/03/2022   TRIG 82 03/02/2020   Lab Results  Component Value Date   CHOLHDL 2.5 05/03/2022   CHOLHDL 3.9 03/02/2020   No results found for: "LDLDIRECT" Glucose:  Glucose, Bld  Date Value Ref Range Status  05/03/2022 93  65 - 99 mg/dL Final    Comment:    .            Fasting reference interval .   03/10/2022 110 (H) 70 - 99 mg/dL Final    Comment:    Glucose reference range applies only to samples taken after fasting for at least 8 hours.  03/08/2022 142 (H) 70 - 99 mg/dL Final    Comment:    Glucose reference range applies only to samples taken after fasting for at least 8 hours.    Flowsheet Row Office Visit from 10/30/2022 in [facility]  AUDIT-C Score 0        Married STD testing and prevention (HIV/chl/gon/syphilis):  completed Sexual history: yes Hep C Screening: completed Skin cancer: Discussed monitoring for atypical lesions Colorectal cancer: 11/15/2020 Prostate cancer:  yes Lab Results  Component Value Date   PSA 2.51 05/03/2022    Lung cancer:  Low Dose CT Chest recommended if Age 66-80 years, 30 pack-year currently smoking OR have quit w/in 15years. Patient  yes a candidate for screening   AAA: The USPSTF recommends one-time screening with ultrasonography in men ages 68 to 75 years who have ever smoked. Patient    yes, a candidate for screening  ECG:  03/09/2022  Vaccines:  HPV: up to at age 18 , ask insurance if age between 107-45  Shingrix: 85-64 yo and ask insurance if covered when patient above 18 yo Pneumonia:  educated and discussed with patient. Flu:  educated and discussed with patient.    Advanced Care Planning: A voluntary discussion about advance care planning including the explanation and discussion of advance directives.  Discussed health care proxy and Living will, and the patient was able to identify a health care proxy as he does not know.  Patient does not have a living will and power of attorney of health care   Patient Active Problem List   Diagnosis Date Noted   Mild intermittent asthma without complication 05/03/2022   Aortic atherosclerosis (HCC) 05/03/2022   Pulmonary emphysema (HCC) 05/03/2022   HLD (hyperlipidemia) 03/09/2022   Lung nodule 03/09/2022   Erectile dysfunction 04/28/2020   Prediabetes 04/13/2020   Essential hypertension 03/02/2020   Smoking 03/02/2020   Encounter to establish care 03/02/2020    Past Surgical History:  Procedure Laterality Date   XI ROBOTIC ASSISTED INGUINAL HERNIA REPAIR WITH MESH Bilateral 09/28/2020   Procedure: XI ROBOTIC ASSISTED INGUINAL HERNIA REPAIR WITH MESH;  Surgeon: Carolan Shiver, MD;  Location: [facility] ORS;  Service: General;  Laterality: Bilateral;    Family History  Problem Relation Age of Onset   Hypertension Mother    Dementia Mother     Social History   Socioeconomic History   Marital status: Married    Spouse name: Not on file   Number of children: 7   Years of education: Not on file   Highest education level: Not on file  Occupational History   Not on file  Tobacco Use   Smoking status: Every Day    Current packs/day: 0.75    Average packs/day: 0.8 packs/day for 47.0 years (35.3 ttl pk-yrs)    Types: Cigarettes   Smokeless tobacco: Never  Vaping Use   Vaping status: Never Used  Substance and  Sexual Activity   Alcohol use: Not Currently   Drug use: Yes    Types: Marijuana   Sexual activity: Yes  Other Topics Concern   Not on file  Social History Narrative  Not on file   Social Determinants of Health   Financial Resource Strain: Not on file  Food Insecurity: No Food Insecurity (10/30/2022)   Hunger Vital Sign    Worried About Running Out of Food in the Last Year: Never true    Ran Out of Food in the Last Year: Never true  Transportation Needs: No Transportation Needs (10/30/2022)   PRAPARE - Administrator, Civil Service (Medical): No    Lack of Transportation (Non-Medical): No  Physical Activity: Inactive (10/30/2022)   Exercise Vital Sign    Days of Exercise per Week: 0 days    Minutes of Exercise per Session: 0 min  Stress: No Stress Concern Present (10/30/2022)   [employer] of Occupational Health - Occupational Stress Questionnaire    Feeling of Stress : Not at all  Social Connections: Socially Integrated (10/30/2022)   Social Connection and Isolation Panel [NHANES]    Frequency of Communication with Friends and Family: More than three times a week    Frequency of Social Gatherings with Friends and Family: More than three times a week    Attends Religious Services: More than 4 times per year    Active Member of [employer] or Organizations: Yes    Attends Engineer, structural: More than 4 times per year    Marital Status: Married  Catering manager Violence: Not At Risk (10/30/2022)   Humiliation, Afraid, Rape, and Kick questionnaire    Fear of Current or Ex-Partner: No    Emotionally Abused: No    Physically Abused: No    Sexually Abused: No     Current Outpatient Medications:    tadalafil (CIALIS) 20 MG tablet, TAKE 1 TABLET BY MOUTH 1 HOUR PRIOR TO INTERCOURSE AS NEEDED, Disp: 90 tablet, Rfl: 0   Testosterone 20.25 MG/ACT (1.62%) GEL, 1 pump applied to each shoulder daily, Disp: 75 g, Rfl: 1   albuterol (VENTOLIN HFA) 108 (90 Base) MCG/ACT  inhaler, Inhale 1-2 puffs into the lungs every 4 (four) hours as needed for wheezing or shortness of breath., Disp: 18 g, Rfl: 3   amLODipine (NORVASC) 10 MG tablet, Take 1 tablet (10 mg total) by mouth daily., Disp: 90 tablet, Rfl: 1  Allergies  Allergen Reactions   Bee Venom Swelling     ROS  Constitutional: Negative for fever or weight change.  Respiratory: Negative for cough and shortness of breath.   Cardiovascular: Negative for chest pain or palpitations.  Gastrointestinal: Negative for abdominal pain, no bowel changes.  Musculoskeletal: Negative for gait problem or joint swelling.  Skin: Negative for rash.  Neurological: Negative for dizziness or headache.  No other specific complaints in a complete review of systems (except as listed in HPI above).   Objective  Vitals:   10/30/22 0755  BP: 124/82  Pulse: 93  Resp: 16  Temp: 98.1 F (36.7 C)  TempSrc: Oral  SpO2: 96%  Weight: 160 lb 4.8 oz (72.7 kg)  Height: 5\' 10"  (1.778 m)    Body mass index is 23 kg/m.  Physical Exam  Constitutional: Patient appears well-developed and well-nourished. No distress.  HENT: Head: Normocephalic and atraumatic. Ears: B TMs ok, no erythema or effusion; Nose: Nose normal. Mouth/Throat: Oropharynx is clear and moist. No oropharyngeal exudate.  Eyes: Conjunctivae and EOM are normal. Pupils are equal, round, and reactive to light. No scleral icterus.  Neck: Normal range of motion. Neck supple. No JVD present. No thyromegaly present.  Cardiovascular: Normal rate, regular rhythm and normal  heart sounds.  No murmur heard. No BLE edema. Pulmonary/Chest: Effort normal and breath sounds normal. No respiratory distress. Abdominal: Soft. Bowel sounds are normal, no distension. There is no tenderness. no masses Musculoskeletal: Normal range of motion, no joint effusions. No gross deformities Neurological: he is alert and oriented to person, place, and time. No cranial nerve deficit.  Coordination, balance, strength, speech and gait are normal.  Skin: Skin is warm and dry. No rash noted. No erythema.  Psychiatric: Patient has a normal mood and affect. behavior is normal. Judgment and thought content normal.   Recent Results (from the past 2160 hour(s))  Testosterone     Status: Abnormal   Collection Time: 10/25/22  9:02 AM  Result Value Ref Range   Testosterone 243 (L) 264 - 916 ng/dL    Comment: Adult male reference interval is based on a population of healthy nonobese males (BMI <30) between 62 and 4 years old. Travison, et.al. JCEM [phone]. PMID: [id number].      Fall Risk:    10/30/2022    7:58 AM 05/03/2022   10:18 AM  Fall Risk   Falls in the past year? 0 0  Number falls in past yr: 0 0  Injury with Fall? 0 0     Functional Status Survey: Is the patient deaf or have difficulty hearing?: No Does the patient have difficulty seeing, even when wearing glasses/contacts?: Yes Does the patient have difficulty concentrating, remembering, or making decisions?: No Does the patient have difficulty walking or climbing stairs?: No Does the patient have difficulty dressing or bathing?: No Does the patient have difficulty doing errands alone such as visiting a doctor's office or shopping?: No    Assessment & Plan  1. Annual physical exam - recently had labs done - up to date on colon cancer screening and prostate cancer screening - Ambulatory Referral Lung Cancer Screening [facility]  2. Encounter for screening for lung cancer  - Ambulatory Referral Lung Cancer Screening [facility]  3. Mild intermittent asthma without complication Needed refill of albuterol inhaler - albuterol (VENTOLIN HFA) 108 (90 Base) MCG/ACT inhaler; Inhale 1-2 puffs into the lungs every 4 (four) hours as needed for wheezing or shortness of breath.  Dispense: 18 g; Refill: 3   4. Essential hypertension Patient reported he needed a refill, no changes -  amLODipine (NORVASC) 10 MG tablet; Take 1 tablet (10 mg total) by mouth daily.  Dispense: 90 tablet; Refill: 1   -Prostate cancer screening and PSA options (with potential risks and benefits of testing vs not testing) were discussed along with recent recs/guidelines. -USPSTF grade A and B recommendations reviewed with patient; age-appropriate recommendations, preventive care, screening tests, etc discussed and encouraged; healthy living encouraged; see AVS for patient education given to patient -Discussed importance of 150 minutes of physical activity weekly, eat two servings of fish weekly, eat one serving of tree nuts ( cashews, pistachios, pecans, almonds.Marland Kitchen) every other day, eat 6 servings of fruit/vegetables daily and drink plenty of water and avoid sweet beverages.  -Reviewed Health Maintenance: yes

## 2022-10-29 ENCOUNTER — Telehealth: Payer: Self-pay

## 2022-10-29 ENCOUNTER — Other Ambulatory Visit: Payer: Self-pay

## 2022-10-29 NOTE — Telephone Encounter (Signed)
Pt left message on triage line- 1056am  Pt states he was suppose to have a rx sent to pharmacy. Went to pick up and it was not there.   LM informing pt that the testosterone was sent to [facility] pharmacy and is ready for p/u with a 4.00 copay. Advised to contact office if med needs to be sent to a different pharmacy.

## 2022-10-30 ENCOUNTER — Other Ambulatory Visit: Payer: Self-pay

## 2022-10-30 ENCOUNTER — Ambulatory Visit (INDEPENDENT_AMBULATORY_CARE_PROVIDER_SITE_OTHER): Payer: Medicaid Other | Admitting: Nurse Practitioner

## 2022-10-30 ENCOUNTER — Encounter: Payer: Self-pay | Admitting: Nurse Practitioner

## 2022-10-30 VITALS — BP 124/82 | HR 93 | Temp 98.1°F | Resp 16 | Ht 70.0 in | Wt 160.3 lb

## 2022-10-30 DIAGNOSIS — Z0000 Encounter for general adult medical examination without abnormal findings: Secondary | ICD-10-CM

## 2022-10-30 DIAGNOSIS — Z122 Encounter for screening for malignant neoplasm of respiratory organs: Secondary | ICD-10-CM | POA: Diagnosis not present

## 2022-10-30 DIAGNOSIS — J4520 Mild intermittent asthma, uncomplicated: Secondary | ICD-10-CM | POA: Diagnosis not present

## 2022-10-30 DIAGNOSIS — I10 Essential (primary) hypertension: Secondary | ICD-10-CM | POA: Diagnosis not present

## 2022-10-30 MED ORDER — ALBUTEROL SULFATE HFA 108 (90 BASE) MCG/ACT IN AERS: 1.0000 | INHALATION_SPRAY | RESPIRATORY_TRACT | 3 refills | Status: DC | PRN

## 2022-10-30 MED ORDER — AMLODIPINE BESYLATE 10 MG PO TABS
10.0000 mg | ORAL_TABLET | Freq: Every day | ORAL | 1 refills | Status: DC
Start: 2022-10-30 — End: 2023-09-02

## 2022-11-07 DIAGNOSIS — J4541 Moderate persistent asthma with (acute) exacerbation: Secondary | ICD-10-CM | POA: Diagnosis not present

## 2022-11-07 DIAGNOSIS — Z760 Encounter for issue of repeat prescription: Secondary | ICD-10-CM | POA: Diagnosis not present

## 2022-11-07 DIAGNOSIS — Z20822 Contact with and (suspected) exposure to covid-19: Secondary | ICD-10-CM | POA: Diagnosis not present

## 2022-11-30 DIAGNOSIS — J209 Acute bronchitis, unspecified: Secondary | ICD-10-CM | POA: Diagnosis not present

## 2022-11-30 DIAGNOSIS — J4531 Mild persistent asthma with (acute) exacerbation: Secondary | ICD-10-CM | POA: Diagnosis not present

## 2022-11-30 DIAGNOSIS — B9689 Other specified bacterial agents as the cause of diseases classified elsewhere: Secondary | ICD-10-CM | POA: Diagnosis not present

## 2022-11-30 DIAGNOSIS — J019 Acute sinusitis, unspecified: Secondary | ICD-10-CM | POA: Diagnosis not present

## 2022-12-03 ENCOUNTER — Other Ambulatory Visit: Payer: Medicaid Other

## 2022-12-03 DIAGNOSIS — E291 Testicular hypofunction: Secondary | ICD-10-CM | POA: Diagnosis not present

## 2022-12-04 LAB — TESTOSTERONE: Testosterone: 696 ng/dL (ref 264–916)

## 2022-12-18 ENCOUNTER — Ambulatory Visit
Admission: RE | Admit: 2022-12-18 | Discharge: 2022-12-18 | Disposition: A | Discharge status: Home / Self Care | Payer: Medicaid Other | Source: Ambulatory Visit | Attending: Acute Care | Admitting: Acute Care

## 2022-12-18 DIAGNOSIS — Z122 Encounter for screening for malignant neoplasm of respiratory organs: Secondary | ICD-10-CM | POA: Insufficient documentation

## 2022-12-18 DIAGNOSIS — F17210 Nicotine dependence, cigarettes, uncomplicated: Secondary | ICD-10-CM | POA: Insufficient documentation

## 2022-12-18 DIAGNOSIS — J45901 Unspecified asthma with (acute) exacerbation: Secondary | ICD-10-CM | POA: Diagnosis not present

## 2022-12-18 DIAGNOSIS — Z87891 Personal history of nicotine dependence: Secondary | ICD-10-CM | POA: Insufficient documentation

## 2022-12-18 DIAGNOSIS — F17218 Nicotine dependence, cigarettes, with other nicotine-induced disorders: Secondary | ICD-10-CM | POA: Diagnosis not present

## 2022-12-26 ENCOUNTER — Other Ambulatory Visit: Payer: Self-pay | Admitting: Acute Care

## 2022-12-26 DIAGNOSIS — Z122 Encounter for screening for malignant neoplasm of respiratory organs: Secondary | ICD-10-CM

## 2022-12-26 DIAGNOSIS — Z87891 Personal history of nicotine dependence: Secondary | ICD-10-CM

## 2022-12-28 ENCOUNTER — Other Ambulatory Visit: Payer: Self-pay | Admitting: Nurse Practitioner

## 2022-12-28 DIAGNOSIS — J4521 Mild intermittent asthma with (acute) exacerbation: Secondary | ICD-10-CM | POA: Diagnosis not present

## 2022-12-28 DIAGNOSIS — J439 Emphysema, unspecified: Secondary | ICD-10-CM | POA: Diagnosis not present

## 2022-12-28 DIAGNOSIS — N5201 Erectile dysfunction due to arterial insufficiency: Secondary | ICD-10-CM

## 2022-12-28 DIAGNOSIS — J209 Acute bronchitis, unspecified: Secondary | ICD-10-CM | POA: Diagnosis not present

## 2022-12-28 MED ORDER — TADALAFIL 20 MG PO TABS
ORAL_TABLET | ORAL | 0 refills | Status: DC
Start: 2022-12-28 — End: 2023-03-25

## 2022-12-28 NOTE — Telephone Encounter (Signed)
Requested Prescriptions  Pending Prescriptions Disp Refills   tadalafil (CIALIS) 20 MG tablet 90 tablet 0    Sig: TAKE 1 TABLET BY MOUTH 1 HOUR PRIOR TO INTERCOURSE AS NEEDED     Urology: Erectile Dysfunction Agents Passed - 12/28/2022 10:11 AM      Passed - AST in normal range and within 360 days    AST  Date Value Ref Range Status  05/03/2022 27 10 - 35 U/L Final         Passed - ALT in normal range and within 360 days    ALT  Date Value Ref Range Status  05/03/2022 17 9 - 46 U/L Final         Passed - Last BP in normal range    BP Readings from Last 1 Encounters:  10/30/22 124/82         Passed - Valid encounter within last 12 months    Recent Outpatient Visits           1 month ago Annual physical exam   [facility] [name], FNP   7 months ago Essential hypertension   [facility] [name], FNP   1 year ago Essential hypertension   [facility] Larae Grooms, NP       Future Appointments             In 3 months Stoioff, Verna Czech, MD [facility] [place]   In 4 months Zane Herald, Rudolpho Sevin, FNP [facility]

## 2022-12-28 NOTE — Telephone Encounter (Signed)
Medication Refill - Medication: tadalafil (CIALIS) 20 MG tablet    Has the patient contacted their pharmacy? no Pt thought he needed to call the Dr I let him know he can cal the pharmacy in the future  Preferred Pharmacy (with phone number or street name): [facility] [address] [place]), [place] - [address] Phone: [phone]  Fax: [phone]   Has the patient been seen for an appointment in the last year OR does the patient have an upcoming appointment? yes  Agent: Please be advised that RX refills may take up to 3 business days. We ask that you follow-up with your pharmacy.

## 2023-01-18 DIAGNOSIS — J45901 Unspecified asthma with (acute) exacerbation: Secondary | ICD-10-CM | POA: Diagnosis not present

## 2023-01-18 DIAGNOSIS — J019 Acute sinusitis, unspecified: Secondary | ICD-10-CM | POA: Diagnosis not present

## 2023-01-18 DIAGNOSIS — J209 Acute bronchitis, unspecified: Secondary | ICD-10-CM | POA: Diagnosis not present

## 2023-01-18 DIAGNOSIS — B9689 Other specified bacterial agents as the cause of diseases classified elsewhere: Secondary | ICD-10-CM | POA: Diagnosis not present

## 2023-01-23 DIAGNOSIS — R0602 Shortness of breath: Secondary | ICD-10-CM | POA: Diagnosis not present

## 2023-02-15 ENCOUNTER — Telehealth: Payer: Self-pay | Admitting: Urology

## 2023-02-15 NOTE — Telephone Encounter (Signed)
Patient called and stated that Testosterone gel is not working for him. He would like to know if there is something else that can be recommended to try. Please advise patient.

## 2023-02-15 NOTE — Telephone Encounter (Signed)
Patient would like to talk about his ed . Made appt.

## 2023-03-11 ENCOUNTER — Ambulatory Visit: Payer: Medicaid Other | Admitting: Physician Assistant

## 2023-03-25 ENCOUNTER — Ambulatory Visit: Payer: Medicaid Other | Admitting: Physician Assistant

## 2023-03-25 VITALS — BP 160/88 | HR 69 | Ht 71.0 in | Wt 175.4 lb

## 2023-03-25 DIAGNOSIS — R399 Unspecified symptoms and signs involving the genitourinary system: Secondary | ICD-10-CM | POA: Diagnosis not present

## 2023-03-25 DIAGNOSIS — N5201 Erectile dysfunction due to arterial insufficiency: Secondary | ICD-10-CM | POA: Diagnosis not present

## 2023-03-25 DIAGNOSIS — E291 Testicular hypofunction: Secondary | ICD-10-CM | POA: Diagnosis not present

## 2023-03-25 LAB — BLADDER SCAN AMB NON-IMAGING: Scan Result: 0 mL

## 2023-03-25 MED ORDER — TESTOSTERONE 20.25 MG/ACT (1.62%) TD GEL
TRANSDERMAL | 1 refills | Status: DC
Start: 2023-03-25 — End: 2023-10-30

## 2023-03-25 MED ORDER — SILDENAFIL CITRATE 20 MG PO TABS: 100.0000 mg | ORAL_TABLET | ORAL | 11 refills | Status: DC | PRN

## 2023-03-25 NOTE — Progress Notes (Signed)
03/25/2023 1:17 PM   Michael Chaney 11/01/1955 [id number]  CC: Chief Complaint  Patient presents with   Erectile Dysfunction   HPI: Michael Chaney is a 67 y.o. male with PMH ED previously on sildenafil and now on tadalafil and hypogonadism on testosterone gel who presents today to discuss PDE 5 inhibitors.   Today he reports he is not responding well to tadalafil.  He thinks the sildenafil worked better for him and would like to go back to it.  He also ran out of testosterone gel about 3 to 4 weeks ago and would like a refill.  He has been taking an OTC herbal supplement for his erections that has been working well for him, name and ingredients unknown.  He also reports a chronic history of urgency, hourly frequency, nocturia x 2-3, without urge incontinence.  He also has some issues with double voiding.  Shim 15.  PVR 0mL.   SHIM     Row Name 03/25/23 1039         SHIM: Over the last 6 months:   How do you rate your confidence that you could get and keep an erection? Low     When you had erections with sexual stimulation, how often were your erections hard enough for penetration (entering your partner)? Sometimes (about half the time)     During sexual intercourse, how often were you able to maintain your erection after you had penetrated (entered) your partner? Sometimes (about half the time)     During sexual intercourse, how difficult was it to maintain your erection to completion of intercourse? Slightly Difficult     When you attempted sexual intercourse, how often was it satisfactory for you? Sometimes (about half the time)       SHIM Total Score   SHIM 15               PMH: Past Medical History:  Diagnosis Date   Asthma    Dyspnea    Hyperlipidemia    Hypertension     Surgical History: Past Surgical History:  Procedure Laterality Date   XI ROBOTIC ASSISTED INGUINAL HERNIA REPAIR WITH MESH Bilateral 09/28/2020   Procedure: XI ROBOTIC ASSISTED  INGUINAL HERNIA REPAIR WITH MESH;  Surgeon: Carolan Shiver, MD;  Location: [facility] ORS;  Service: General;  Laterality: Bilateral;    Home Medications:  Allergies as of 03/25/2023       Reactions   Bee Venom Swelling        Medication List        Accurate as of March 25, 2023  1:17 PM. If you have any questions, ask your nurse or doctor.          STOP taking these medications    tadalafil 20 MG tablet Commonly known as: CIALIS       TAKE these medications    albuterol 108 (90 Base) MCG/ACT inhaler Commonly known as: VENTOLIN HFA Inhale 1-2 puffs into the lungs every 4 (four) hours as needed for wheezing or shortness of breath.   amLODipine 10 MG tablet Commonly known as: NORVASC Take 1 tablet (10 mg total) by mouth daily.   sildenafil 20 MG tablet Commonly known as: REVATIO Take 5 tablets (100 mg total) by mouth as needed (One hour prior to sex).   Testosterone 20.25 MG/ACT (1.62%) Gel 1 pump applied to each shoulder daily        Allergies:  Allergies  Allergen Reactions   Bee Venom Swelling  Family History: Family History  Problem Relation Age of Onset   Hypertension Mother    Dementia Mother     Social History:   reports that he has been smoking cigarettes. He has a 35.3 pack-year smoking history. He has never used smokeless tobacco. He reports that he does not currently use alcohol. He reports current drug use. Drug: Marijuana.  Physical Exam: BP (!) 160/88   Pulse 69   Ht 5\' 11"  (1.803 m)   Wt 175 lb 7 oz (79.6 kg)   BMI 24.47 kg/m   Constitutional:  Alert and oriented, no acute distress, nontoxic appearing HEENT: [place], AT Cardiovascular: No clubbing, cyanosis, or edema Respiratory: Normal respiratory effort, no increased work of breathing Skin: No rashes, bruises or suspicious lesions Neurologic: Grossly intact, no focal deficits, moving all 4 extremities Psychiatric: Normal mood and affect  Laboratory Data: Results for  orders placed or performed in visit on 03/25/23  BLADDER SCAN AMB NON-IMAGING   Collection Time: 03/25/23  9:19 AM  Result Value Ref Range   Scan Result 0 ml   Assessment & Plan:   1. Hypogonadism in male (Primary) Ran out of testosterone gel, refilling today.  He is scheduled for routine follow-up with Dr. Lonna Cobb next month and I encouraged him to keep this appointment. - Testosterone 20.25 MG/ACT (1.62%) GEL; 1 pump applied to each shoulder daily  Dispense: 75 g; Refill: 1  2. Erectile dysfunction due to arterial insufficiency Will switch him back to sildenafil from tadalafil, since this seemed to work better for him.  We discussed the need to manually stimulate an erection on these meds.  We also discussed warning signs of priapism including painful erections lasting greater than 4 hours. - sildenafil (REVATIO) 20 MG tablet; Take 5 tablets (100 mg total) by mouth as needed (One hour prior to sex).  Dispense: 30 tablet; Refill: 11  3. Lower urinary tract symptoms (LUTS) He is emptying appropriately.  Will start oxybutynin XL 10 mg daily and have him follow-up with Dr. Lonna Cobb as scheduled next month. - BLADDER SCAN AMB NON-IMAGING - oxybutynin (DITROPAN-XL) 10 MG 24 hr tablet; Take 1 tablet (10 mg total) by mouth daily.  Dispense: 30 tablet; Refill: 0   Return for Keep follow-up as scheduled.  Carman Ching, PA-C  [facility] [place] [address] [place], [place] [address] [phone]

## 2023-03-26 MED ORDER — OXYBUTYNIN CHLORIDE ER 10 MG PO TB24: 10.0000 mg | ORAL_TABLET | Freq: Every day | ORAL | 0 refills | Status: DC

## 2023-04-05 ENCOUNTER — Other Ambulatory Visit: Payer: Medicaid Other

## 2023-04-08 ENCOUNTER — Ambulatory Visit: Payer: Medicaid Other | Admitting: Urology

## 2023-04-09 ENCOUNTER — Other Ambulatory Visit: Payer: Self-pay | Admitting: *Deleted

## 2023-04-09 DIAGNOSIS — R972 Elevated prostate specific antigen [PSA]: Secondary | ICD-10-CM

## 2023-04-09 DIAGNOSIS — E291 Testicular hypofunction: Secondary | ICD-10-CM

## 2023-04-10 ENCOUNTER — Other Ambulatory Visit: Payer: Medicaid Other

## 2023-04-10 DIAGNOSIS — R972 Elevated prostate specific antigen [PSA]: Secondary | ICD-10-CM | POA: Diagnosis not present

## 2023-04-10 DIAGNOSIS — E291 Testicular hypofunction: Secondary | ICD-10-CM | POA: Diagnosis not present

## 2023-04-11 DIAGNOSIS — J4489 Other specified chronic obstructive pulmonary disease: Secondary | ICD-10-CM | POA: Diagnosis not present

## 2023-04-11 LAB — HEMOGLOBIN AND HEMATOCRIT, BLOOD
Hematocrit: 44.8 % (ref 37.5–51.0)
Hemoglobin: 15 g/dL (ref 13.0–17.7)

## 2023-04-11 LAB — TESTOSTERONE: Testosterone: 284 ng/dL (ref 264–916)

## 2023-04-11 LAB — PSA: Prostate Specific Ag, Serum: 2.6 ng/mL (ref 0.0–4.0)

## 2023-04-15 ENCOUNTER — Ambulatory Visit: Payer: Medicaid Other | Admitting: Urology

## 2023-04-15 ENCOUNTER — Encounter: Payer: Self-pay | Admitting: Urology

## 2023-04-15 VITALS — BP 149/95 | HR 76

## 2023-04-15 DIAGNOSIS — E291 Testicular hypofunction: Secondary | ICD-10-CM | POA: Diagnosis not present

## 2023-04-15 DIAGNOSIS — N5201 Erectile dysfunction due to arterial insufficiency: Secondary | ICD-10-CM | POA: Diagnosis not present

## 2023-04-15 DIAGNOSIS — R399 Unspecified symptoms and signs involving the genitourinary system: Secondary | ICD-10-CM | POA: Diagnosis not present

## 2023-04-15 LAB — BLADDER SCAN AMB NON-IMAGING: Scan Result: 12

## 2023-04-15 MED ORDER — SILDENAFIL CITRATE 100 MG PO TABS: ORAL_TABLET | ORAL | 0 refills | Status: DC

## 2023-04-15 NOTE — Progress Notes (Signed)
I, Michael Chaney, acting as a scribe for Michael Altes, MD., have documented all relevant documentation on the behalf of Michael Altes, MD, as directed by Michael Altes, MD while in the presence of Michael Altes, MD.  04/15/2023 3:46 PM   [name] 1955-12-02 [id number]  Referring provider: [name], FNP [address] [place],  [place] [address]  Chief Complaint  Patient presents with   Follow-up   Urologic History: 1. Erectile Dysfunction Organic risk factors include hyperlipidemia, hypertension and antihypertensive medications 35 pack-year tobacco history and current smoker at 1 pack/day Trial sildenafil with improvement in erections  HPI: Michael Chaney is a 68 y.o. male presents for 6 month follow-up.  He saw Shari Prows 03/25/2023 with complaints of ED wanting to go back on sildenafil instead of tadalafil, as he thought this was more effective.  He was also complaining of urgency, frequency, and nocturia x2-3, and was interested in taking medication. His PVR was 0 and he was given Rx for oxybutynin. However he decided not to take the medication.  Still complaining of ED with sildenafil up to 100 mg. He has been applying his testosterone gel regularly Labs drawn 04/10/2023 remarkable for testosterone level 284 ng/dL, PSA stable at 2.6, and normal H/H.   PMH: Past Medical History:  Diagnosis Date   Asthma    Dyspnea    Hyperlipidemia    Hypertension     Surgical History: Past Surgical History:  Procedure Laterality Date   XI ROBOTIC ASSISTED INGUINAL HERNIA REPAIR WITH MESH Bilateral 09/28/2020   Procedure: XI ROBOTIC ASSISTED INGUINAL HERNIA REPAIR WITH MESH;  Surgeon: Carolan Shiver, MD;  Location: [facility] ORS;  Service: General;  Laterality: Bilateral;    Home Medications:  Allergies as of 04/15/2023       Reactions   Bee Venom Swelling        Medication List        Accurate as of April 15, 2023  3:46  PM. If you have any questions, ask your nurse or doctor.          STOP taking these medications    sildenafil 20 MG tablet Commonly known as: REVATIO Stopped by: Michael Chaney       TAKE these medications    albuterol 108 (90 Base) MCG/ACT inhaler Commonly known as: VENTOLIN HFA Inhale 1-2 puffs into the lungs every 4 (four) hours as needed for wheezing or shortness of breath.   amLODipine 10 MG tablet Commonly known as: NORVASC Take 1 tablet (10 mg total) by mouth daily.   oxybutynin 10 MG 24 hr tablet Commonly known as: DITROPAN-XL Take 1 tablet (10 mg total) by mouth daily.   sildenafil 100 MG tablet Commonly known as: VIAGRA Take 1 tab 1 hour prior to intercourse Started by: Michael Chaney   Testosterone 20.25 MG/ACT (1.62%) Gel 1 pump applied to each shoulder daily   varenicline 0.5 MG tablet Commonly known as: CHANTIX Take by mouth.        Allergies:  Allergies  Allergen Reactions   Bee Venom Swelling    Family History: Family History  Problem Relation Age of Onset   Hypertension Mother    Dementia Mother     Social History:  reports that he has been smoking cigarettes. He has a 35.3 pack-year smoking history. He has never used smokeless tobacco. He reports that he does not currently use alcohol. He reports current drug use. Drug: Marijuana.  Physical Exam: BP (!) 149/95   Pulse 76   Constitutional:  Alert and oriented, No acute distress. HEENT: [place] AT, moist mucus membranes.  Trachea midline, no masses. Cardiovascular: No clubbing, cyanosis, or edema. Respiratory: Normal respiratory effort, no increased work of breathing. GI: Abdomen is soft, nontender, nondistended, no abdominal masses Skin: No rashes, bruises or suspicious lesions. Neurologic: Grossly intact, no focal deficits, moving all 4 extremities. Psychiatric: Normal mood and affect.   Assessment & Plan:    1. Hypogonadism Apply gel regularly with recent testosterone level  low at 289. We discussed that he may not be absorbing the gel.  Discussed starting testosterone injections or increasing the dose. He has elected the latter and will apply 4 pumps daily.  Lab visit for testosterone level 6 weeks.   2. Erectile dysfunction We discussed the PD-5 inhibitor may be effective with a better testosterone level. He did desire a 100 mg tablet instead of taking 5 20 mg tablets.   3. Lower urinary tract symptoms PVR to date 12 mL.  He elected not to start oxybutynin.  [facility] [address], [address] [place], [place] [address] [phone]

## 2023-04-18 DIAGNOSIS — J019 Acute sinusitis, unspecified: Secondary | ICD-10-CM | POA: Diagnosis not present

## 2023-04-18 DIAGNOSIS — R002 Palpitations: Secondary | ICD-10-CM | POA: Diagnosis not present

## 2023-04-18 DIAGNOSIS — R008 Other abnormalities of heart beat: Secondary | ICD-10-CM | POA: Diagnosis not present

## 2023-04-18 DIAGNOSIS — I499 Cardiac arrhythmia, unspecified: Secondary | ICD-10-CM | POA: Diagnosis not present

## 2023-04-18 DIAGNOSIS — J40 Bronchitis, not specified as acute or chronic: Secondary | ICD-10-CM | POA: Diagnosis not present

## 2023-04-21 ENCOUNTER — Other Ambulatory Visit: Payer: Self-pay | Admitting: Physician Assistant

## 2023-04-21 DIAGNOSIS — R399 Unspecified symptoms and signs involving the genitourinary system: Secondary | ICD-10-CM

## 2023-04-23 ENCOUNTER — Other Ambulatory Visit: Payer: Self-pay | Admitting: Urology

## 2023-04-23 DIAGNOSIS — H5213 Myopia, bilateral: Secondary | ICD-10-CM | POA: Diagnosis not present

## 2023-04-25 ENCOUNTER — Encounter: Payer: Self-pay | Admitting: Intensive Care

## 2023-04-25 ENCOUNTER — Other Ambulatory Visit: Payer: Self-pay

## 2023-04-25 ENCOUNTER — Emergency Department: Payer: Medicaid Other

## 2023-04-25 DIAGNOSIS — J45909 Unspecified asthma, uncomplicated: Secondary | ICD-10-CM | POA: Insufficient documentation

## 2023-04-25 DIAGNOSIS — R071 Chest pain on breathing: Secondary | ICD-10-CM | POA: Diagnosis not present

## 2023-04-25 DIAGNOSIS — I10 Essential (primary) hypertension: Secondary | ICD-10-CM | POA: Insufficient documentation

## 2023-04-25 DIAGNOSIS — R079 Chest pain, unspecified: Secondary | ICD-10-CM | POA: Diagnosis not present

## 2023-04-25 DIAGNOSIS — R0789 Other chest pain: Secondary | ICD-10-CM | POA: Diagnosis not present

## 2023-04-25 DIAGNOSIS — J449 Chronic obstructive pulmonary disease, unspecified: Secondary | ICD-10-CM | POA: Diagnosis not present

## 2023-04-25 LAB — CBC
HCT: 48.2 % (ref 39.0–52.0)
Hemoglobin: 15.8 g/dL (ref 13.0–17.0)
MCH: 31.3 pg (ref 26.0–34.0)
MCHC: 32.8 g/dL (ref 30.0–36.0)
MCV: 95.4 fL (ref 80.0–100.0)
Platelets: 277 10*3/uL (ref 150–400)
RBC: 5.05 MIL/uL (ref 4.22–5.81)
RDW: 12.9 % (ref 11.5–15.5)
WBC: 12.8 10*3/uL — ABNORMAL HIGH (ref 4.0–10.5)
nRBC: 0 % (ref 0.0–0.2)

## 2023-04-25 LAB — BASIC METABOLIC PANEL
Anion gap: 13 (ref 5–15)
BUN: 14 mg/dL (ref 8–23)
CO2: 23 mmol/L (ref 22–32)
Calcium: 9.9 mg/dL (ref 8.9–10.3)
Chloride: 106 mmol/L (ref 98–111)
Creatinine, Ser: 0.88 mg/dL (ref 0.61–1.24)
GFR, Estimated: >60 mL/min (ref >60)
Glucose, Bld: 93 mg/dL (ref 70–99)
Potassium: 3.8 mmol/L (ref 3.5–5.1)
Sodium: 142 mmol/L (ref 135–145)

## 2023-04-25 LAB — TROPONIN I (HIGH SENSITIVITY): Troponin I (High Sensitivity): 7 ng/L (ref <18)

## 2023-04-25 NOTE — ED Triage Notes (Signed)
Patient c/o intermittent left sided chest pain. Describes as a cramp. Started today around 2:30am.

## 2023-04-26 ENCOUNTER — Emergency Department
Admission: EM | Admit: 2023-04-26 | Discharge: 2023-04-26 | Disposition: A | Discharge status: Home / Self Care | Payer: Medicaid Other | Attending: Emergency Medicine | Admitting: Emergency Medicine

## 2023-04-26 DIAGNOSIS — R079 Chest pain, unspecified: Secondary | ICD-10-CM

## 2023-04-26 LAB — TROPONIN I (HIGH SENSITIVITY): Troponin I (High Sensitivity): 6 ng/L (ref <18)

## 2023-04-26 NOTE — ED Provider Notes (Signed)
[facility] Provider Note    Event Date/Time   First MD Initiated Contact with Patient 04/26/23 0216     (approximate)   History   Chest Pain   HPI  Michael Chaney is a 68 year old male with history of hypertension, asthma-COPD presenting to the emergency department for evaluation of chest pain.  On 1/30 around 2:30 AM, patient was drinking when he had onset of left-sided chest pain with some associated shortness of breath.  Unsure exactly how long the symptoms lasted, but does currently feel that they are improved.  Denies history of similar.  Did recently have palpitations for which he he wore a heart monitor and is awaiting results.  Does have a history of asthma, but tried using his inhaler without significant benefit.    Physical Exam   Triage Vital Signs: ED Triage Vitals  Encounter Vitals Group     BP 04/25/23 1849 (!) 146/73     Systolic BP Percentile --      Diastolic BP Percentile --      Pulse Rate 04/25/23 1849 79     Resp 04/25/23 1849 16     Temp 04/25/23 1849 98.6 F (37 C)     Temp Source 04/25/23 1849 Oral     SpO2 04/25/23 1849 99 %     Weight 04/25/23 1856 174 lb (78.9 kg)     Height 04/25/23 1856 5\' 11"  (1.803 m)     Head Circumference --      Peak Flow --      Pain Score 04/25/23 1856 0     Pain Loc --      Pain Education --      Exclude from Growth Chart --     Most recent vital signs: Vitals:   04/25/23 1849 04/26/23 0026  BP: (!) 146/73 133/83  Pulse: 79 73  Resp: 16 17  Temp: 98.6 F (37 C) 98.1 F (36.7 C)  SpO2: 99% 96%     General: Awake, interactive  CV:  Regular rate, good peripheral perfusion.  Resp:  Unlabored respirations, lungs good auscultation Abd:  Nondistended, soft, nontender to palpation Neuro:  Symmetric facial movement, fluid speech   ED Results / Procedures / Treatments   Labs (all labs ordered are listed, but only abnormal results are displayed) Labs Reviewed  CBC - Abnormal;  Notable for the following components:      Result Value   WBC 12.8 (*)    All other components within normal limits  BASIC METABOLIC PANEL  TROPONIN I (HIGH SENSITIVITY)  TROPONIN I (HIGH SENSITIVITY)     EKG EKG independently reviewed interpreted by myself (ER attending) demonstrates:  EKG demonstrates normal sinus rhythm at a rate of 65, PR 150, QRS 80, QTc 401, no acute ST changes  RADIOLOGY Imaging independently reviewed and interpreted by myself demonstrates:  CXR without focal consolidation  PROCEDURES:  Critical Care performed: No  Procedures   MEDICATIONS ORDERED IN ED: Medications - No data to display   IMPRESSION / MDM / ASSESSMENT AND PLAN / ED COURSE  I reviewed the triage vital signs and the nursing notes.  Differential diagnosis includes, but is not limited to, ACS, pneumothorax, pneumonia, GI-related chest pain, low suspicion PE, aortic dissection based on clinical history and resolution of symptoms  Patient's presentation is most consistent with acute presentation with potential threat to life or bodily function.  68 year old male presenting to the emergency department for evaluation of chest pain.  Vital stable  on presentation.  Labs reassuring including troponin x 2.  X-Elmyra Banwart without focal consolidation.  EKG without acute ischemic findings.  Patient with resolution of chest pain at the time of my initial evaluation.  Does have some risk factors, but with resolution of symptoms, I did discuss admission versus discharge with outpatient follow-up.  Patient is comfortable with discharge and follow-up with cardiology.  Will place referral.  Strict return precautions provided.  Patient discharged stable condition.      FINAL CLINICAL IMPRESSION(S) / ED DIAGNOSES   Final diagnoses:  Nonspecific chest pain     Rx / DC Orders   ED Discharge Orders          Ordered    Ambulatory referral to Cardiology       Comments: If you have not heard from the  Cardiology office within the next 72 hours please call [phone].   04/26/23 0315             Note:  This document was prepared using Dragon voice recognition software and may include unintentional dictation errors.   Trinna Post, MD 04/26/23 [phone]

## 2023-04-26 NOTE — Discharge Instructions (Signed)
You were seen in the Emergency Department today for evaluation of your chest pain. Fortunately, your labs, EKG, and chest x-Michael Chaney were overall reassuring against a emergency cause for your pain.  I have placed a referral to cardiology to arrange follow-up for further evaluation.  Return to the ER for any new or worsening symptoms including worsening chest pain, difficulty breathing, or any other new or concerning symptoms that you believe warrants immediate attention.

## 2023-04-29 DIAGNOSIS — R002 Palpitations: Secondary | ICD-10-CM | POA: Diagnosis not present

## 2023-04-30 ENCOUNTER — Telehealth: Payer: Self-pay

## 2023-04-30 NOTE — Transitions of Care (Post Inpatient/ED Visit) (Signed)
   04/30/2023  Name: Michael Chaney MRN: [id number] DOB: 20-Mar-1956  Today's TOC FU Call Status: Today's TOC FU Call Status:: Unsuccessful Call (1st Attempt) Unsuccessful Call (1st Attempt) Date: 04/30/23  Attempted to reach the patient regarding the most recent Inpatient/ED visit.  Follow Up Plan: Additional outreach attempts will be made to reach the patient to complete the Transitions of Care (Post Inpatient/ED visit) call.   [name], CMA  [facility] AWV Team Direct Dial: [phone]

## 2023-05-01 NOTE — Transitions of Care (Post Inpatient/ED Visit) (Signed)
   05/01/2023  Name: Michael Chaney MRN: [id number] DOB: 1955-06-20  Today's TOC FU Call Status: Today's TOC FU Call Status:: Successful TOC FU Call Completed Unsuccessful Call (1st Attempt) Date: 04/30/23 [facility] FU Call Complete Date: 05/01/23 Patient's Name and Date of Birth confirmed.  Transition Care Management Follow-up Telephone Call Date of Discharge: 04/26/23 Discharge Facility: [facility]) Type of Discharge: Emergency Department Reason for ED Visit: Other: (chest pain) Cardiac Conditions Diagnosis: Chest Pain Persisting How have you been since you were released from the hospital?: Better Any questions or concerns?: No  Items Reviewed: Did you receive and understand the discharge instructions provided?: Yes Medications obtained,verified, and reconciled?: Yes (Medications Reviewed) Any new allergies since your discharge?: No Dietary orders reviewed?: NA Do you have support at home?: Yes  Medications Reviewed Today: Medications Reviewed Today     Reviewed by Daaiel Starlin, [place] LABOR, CMA (Certified Medical Assistant) on 05/01/23 at 1345  Med List Status: <None>   Medication Order Taking? Sig Documenting Provider Last Dose Status Informant  albuterol  (VENTOLIN  HFA) 108 (90 Base) MCG/ACT inhaler [id number] No Inhale 1-2 puffs into the lungs every 4 (four) hours as needed for wheezing or shortness of breath. Gareth Mliss FALCON, FNP Taking Active   amLODipine  (NORVASC ) 10 MG tablet [id number] No Take 1 tablet (10 mg total) by mouth daily. Gareth Mliss FALCON, FNP Taking Active   oxybutynin  (DITROPAN -XL) 10 MG 24 hr tablet [id number] No Take 1 tablet (10 mg total) by mouth daily. Vaillancourt, Samantha, PA-C Taking Active   sildenafil  (VIAGRA ) 100 MG tablet [id number]  TAKE 1 TABLET BY MOUTH 1 HOUR PRIOR TO INTERCOURSE Stoioff, Scott C, MD  Active   Testosterone  20.25 MG/ACT (1.62%) GEL [id number] No 1 pump applied to each shoulder daily Vaillancourt, Samantha, PA-C Taking  Active   varenicline (CHANTIX) 0.5 MG tablet [id number] No Take by mouth. [provider] Taking Active             Home Care and Equipment/Supplies: Were Home Health Services Ordered?: NA Any new equipment or medical supplies ordered?: NA  Functional Questionnaire: Do you need assistance with bathing/showering or dressing?: No Do you need assistance with meal preparation?: No Do you need assistance with eating?: No Do you have difficulty maintaining continence: No Do you need assistance with getting out of bed/getting out of a chair/moving?: No Do you have difficulty managing or taking your medications?: No  Follow up appointments reviewed: PCP Follow-up appointment confirmed?: Yes Date of PCP follow-up appointment?: 05/02/23 Follow-up Provider: JINNY Gareth Specialist [facility] Follow-up appointment confirmed?: NA Do you need transportation to your follow-up appointment?: No Do you understand care options if your condition(s) worsen?: Yes-patient verbalized understanding    Shametra Cumberland, CMA  [facility] AWV Team Direct Dial: [phone]

## 2023-05-02 ENCOUNTER — Encounter: Payer: Self-pay | Admitting: Nurse Practitioner

## 2023-05-02 ENCOUNTER — Ambulatory Visit: Payer: Medicaid Other | Admitting: Nurse Practitioner

## 2023-05-02 VITALS — BP 134/84 | HR 80 | Temp 97.7°F | Resp 18 | Ht 71.0 in | Wt 175.0 lb

## 2023-05-02 DIAGNOSIS — R079 Chest pain, unspecified: Secondary | ICD-10-CM

## 2023-05-02 DIAGNOSIS — I700 Atherosclerosis of aorta: Secondary | ICD-10-CM

## 2023-05-02 DIAGNOSIS — R7303 Prediabetes: Secondary | ICD-10-CM | POA: Diagnosis not present

## 2023-05-02 DIAGNOSIS — E782 Mixed hyperlipidemia: Secondary | ICD-10-CM | POA: Diagnosis not present

## 2023-05-02 DIAGNOSIS — J4520 Mild intermittent asthma, uncomplicated: Secondary | ICD-10-CM | POA: Diagnosis not present

## 2023-05-02 DIAGNOSIS — I10 Essential (primary) hypertension: Secondary | ICD-10-CM | POA: Diagnosis not present

## 2023-05-02 DIAGNOSIS — F17200 Nicotine dependence, unspecified, uncomplicated: Secondary | ICD-10-CM | POA: Diagnosis not present

## 2023-05-02 DIAGNOSIS — J439 Emphysema, unspecified: Secondary | ICD-10-CM | POA: Diagnosis not present

## 2023-05-02 DIAGNOSIS — R002 Palpitations: Secondary | ICD-10-CM | POA: Diagnosis not present

## 2023-05-02 NOTE — Progress Notes (Signed)
 BP 134/84   Pulse 80   Temp 97.7 F (36.5 C)   Resp 18   Ht 5' 11 (1.803 m)   Wt 175 lb (79.4 kg)   BMI 24.41 kg/m    Subjective:    Patient ID: Michael Chaney, male    DOB: May 19, 1955, 68 y.o.   MRN: [id number]  HPI: Michael Chaney is a 68 y.o. male  Chief Complaint  Patient presents with   Medical Management of Chronic Issues    And ER f/u for chest pain    Discussed the use of AI scribe software for clinical note transcription with the patient, who gave verbal consent to proceed.  History of Present Illness   The patient, with a history of hypertension, aortic atherosclerosis, hyperlipidemia, prediabetes, asthma and emphysema, presents for a follow-up after a recent emergency room visit for chest pain. The patient underwent a full cardiac workup in the ER, including EKG, chest x-ray, and troponin tests. The troponin tests were negative, and the chest x-ray showed chronic lung changes consistent with the patient's history of emphysema. The patient  has  an appointment with a cardiologist today.   The patient also discusses a recent stressful event at work that led to him quitting his job. The patient describes a confrontation with a doctor, general practice, which escalated to the point where the patient felt threatened and chose to leave his job. The patient expresses concern about the impact of this stress on his health, particularly his blood pressure.  The patient admits to ongoing smoking, despite being aware of its negative impact on his health. He has been prescribed Advair for his emphysema, which he uses as needed when he has difficulty breathing.       05/02/2023    8:28 AM 10/30/2022    7:58 AM 05/03/2022   10:18 AM  Depression screen PHQ 2/9  Decreased Interest 0 0 0  Down, Depressed, Hopeless 1 0 0  PHQ - 2 Score 1 0 0  Altered sleeping 2    Tired, decreased energy 2    Change in appetite 0    Feeling bad or failure about yourself  1    Trouble  concentrating 0    Moving slowly or fidgety/restless 0    Suicidal thoughts 1    PHQ-9 Score 7      Relevant past medical, surgical, family and social history reviewed and updated as indicated. Interim medical history since our last visit reviewed. Allergies and medications reviewed and updated.  Review of Systems  Constitutional: Negative for fever or weight change.  Respiratory: Negative for cough and shortness of breath.   Cardiovascular: positive for chest pain or palpitations.  Gastrointestinal: Negative for abdominal pain, no bowel changes.  Musculoskeletal: Negative for gait problem or joint swelling.  Skin: Negative for rash.  Neurological: Negative for dizziness or headache.  No other specific complaints in a complete review of systems (except as listed in HPI above).      Objective:    BP 134/84   Pulse 80   Temp 97.7 F (36.5 C)   Resp 18   Ht 5' 11 (1.803 m)   Wt 175 lb (79.4 kg)   BMI 24.41 kg/m   BP Readings from Last 3 Encounters:  05/02/23 134/84  04/26/23 133/83  04/15/23 (!) 149/95     Wt Readings from Last 3 Encounters:  05/02/23 175 lb (79.4 kg)  04/25/23 174 lb (78.9 kg)  03/25/23 175 lb 7 oz (79.6  kg)    Physical Exam Vitals reviewed.  Constitutional:      Appearance: Normal appearance.  HENT:     Head: Normocephalic.  Cardiovascular:     Rate and Rhythm: Normal rate and regular rhythm.  Pulmonary:     Effort: Pulmonary effort is normal.     Breath sounds: Normal breath sounds.  Musculoskeletal:        General: Normal range of motion.  Skin:    General: Skin is warm and dry.  Neurological:     General: No focal deficit present.     Mental Status: He is alert and oriented to person, place, and time. Mental status is at baseline.  Psychiatric:        Mood and Affect: Mood normal.        Behavior: Behavior normal.        Thought Content: Thought content normal.        Judgment: Judgment normal.     Results for orders placed or  performed during the hospital encounter of 04/26/23  Basic metabolic panel   Collection Time: 04/25/23  6:58 PM  Result Value Ref Range   Sodium 142 135 - 145 mmol/L   Potassium 3.8 3.5 - 5.1 mmol/L   Chloride 106 98 - 111 mmol/L   CO2 23 22 - 32 mmol/L   Glucose, Bld 93 70 - 99 mg/dL   BUN 14 8 - 23 mg/dL   Creatinine, Ser 9.11 0.61 - 1.24 mg/dL   Calcium  9.9 8.9 - 10.3 mg/dL   GFR, Estimated >39 >39 mL/min   Anion gap 13 5 - 15  CBC   Collection Time: 04/25/23  6:58 PM  Result Value Ref Range   WBC 12.8 (H) 4.0 - 10.5 K/uL   RBC 5.05 4.22 - 5.81 MIL/uL   Hemoglobin 15.8 13.0 - 17.0 g/dL   HCT 51.7 60.9 - 47.9 %   MCV 95.4 80.0 - 100.0 fL   MCH 31.3 26.0 - 34.0 pg   MCHC 32.8 30.0 - 36.0 g/dL   RDW 87.0 88.4 - 84.4 %   Platelets 277 150 - 400 K/uL   nRBC 0.0 0.0 - 0.2 %  Troponin I (High Sensitivity)   Collection Time: 04/25/23  6:58 PM  Result Value Ref Range   Troponin I (High Sensitivity) 7 <18 ng/L  Troponin I (High Sensitivity)   Collection Time: 04/26/23  2:06 AM  Result Value Ref Range   Troponin I (High Sensitivity) 6 <18 ng/L   Last metabolic panel Lab Results  Component Value Date   GLUCOSE 93 04/25/2023   NA 142 04/25/2023   K 3.8 04/25/2023   CL 106 04/25/2023   CO2 23 04/25/2023   BUN 14 04/25/2023   CREATININE 0.88 04/25/2023   GFRNONAA >60 04/25/2023   CALCIUM  9.9 04/25/2023   PROT 7.5 05/03/2022   ALBUMIN 3.9 09/16/2020   LABGLOB 2.9 03/02/2020   AGRATIO 1.7 03/02/2020   BILITOT 0.4 05/03/2022   ALKPHOS 53 09/16/2020   AST 27 05/03/2022   ALT 17 05/03/2022   ANIONGAP 13 04/25/2023   Last lipids Lab Results  Component Value Date   CHOL 173 05/03/2022   HDL 68 05/03/2022   LDLCALC 85 05/03/2022   TRIG 106 05/03/2022   CHOLHDL 2.5 05/03/2022   Last hemoglobin A1c Lab Results  Component Value Date   HGBA1C 6.0 (H) 05/03/2022        Assessment & Plan:   Problem List Items Addressed This Visit  Cardiovascular and  Mediastinum   Essential hypertension   Aortic atherosclerosis (HCC)     Respiratory   Mild intermittent asthma without complication   Relevant Medications   fluticasone -salmeterol (ADVAIR) 100-50 MCG/ACT AEPB   Pulmonary emphysema (HCC)   Relevant Medications   fluticasone -salmeterol (ADVAIR) 100-50 MCG/ACT AEPB     Other   Smoking   Prediabetes   HLD (hyperlipidemia)   Other Visit Diagnoses       Chest pain, unspecified type    -  Primary        Assessment and Plan    Chest Pain Recent ER visit on 04/26/2023 for chest pain. Negative troponins and chronic lung changes on chest x-ray. Patient has a history of hypertension, aortic atherosclerosis, and hyperlipidemia.  No current chest pain reported. -Cardiology appointment scheduled for today (05/02/2023). Await results and recommendations from cardiology.  Emphysema Chronic lung changes noted on recent chest x-ray. Patient reports using albuterol  inhaler as needed and recently started on Advair with good response.  Patient continues to smoke. -Continue current inhaler regimen. Encourage smoking cessation.  Prediabetes Last A1c was 6.0. -Monitor blood glucose levels. Encourage lifestyle modifications including diet, exercise, and smoking cessation.  Hypertension Patient is on amlodipine  10mg  daily. Reports high stress levels due to recent job loss. -Continue amlodipine  10mg  daily. Encourage stress management techniques.  Hyperlipidemia No current changes or concerns discussed. -due for lipid check,  has appointment with cardiology today will defer  General Health Maintenance / Followup Plans -Cardiology to conduct labs today. Await results and recommendations. -Follow up after cardiology appointment to discuss results and any necessary changes to management plan.        Follow up plan: Return in about 6 months (around 10/30/2023) for follow up.

## 2023-05-06 ENCOUNTER — Other Ambulatory Visit: Payer: Self-pay | Admitting: Urology

## 2023-05-08 DIAGNOSIS — R002 Palpitations: Secondary | ICD-10-CM | POA: Diagnosis not present

## 2023-05-15 ENCOUNTER — Telehealth: Payer: Self-pay | Admitting: Nurse Practitioner

## 2023-05-15 DIAGNOSIS — R002 Palpitations: Secondary | ICD-10-CM | POA: Diagnosis not present

## 2023-05-15 NOTE — Telephone Encounter (Signed)
Pt said that he is needing refill generic of Cialis. Ilda Basset is Statistician on [employer].

## 2023-05-15 NOTE — Telephone Encounter (Signed)
Ot on current med list

## 2023-05-16 ENCOUNTER — Other Ambulatory Visit: Payer: Self-pay | Admitting: *Deleted

## 2023-05-16 ENCOUNTER — Other Ambulatory Visit: Payer: Self-pay | Admitting: Nurse Practitioner

## 2023-05-16 MED ORDER — SILDENAFIL CITRATE 100 MG PO TABS: ORAL_TABLET | ORAL | 1 refills | Status: DC

## 2023-05-16 NOTE — Telephone Encounter (Signed)
Pt notified to contact their urologist for refills

## 2023-05-29 ENCOUNTER — Encounter: Payer: Self-pay | Admitting: Urology

## 2023-05-29 ENCOUNTER — Other Ambulatory Visit: Payer: Self-pay

## 2023-05-30 ENCOUNTER — Encounter: Payer: Self-pay | Admitting: Urology

## 2023-06-04 ENCOUNTER — Other Ambulatory Visit

## 2023-06-04 DIAGNOSIS — E291 Testicular hypofunction: Secondary | ICD-10-CM

## 2023-06-05 ENCOUNTER — Other Ambulatory Visit: Payer: Self-pay | Admitting: *Deleted

## 2023-06-05 DIAGNOSIS — E291 Testicular hypofunction: Secondary | ICD-10-CM

## 2023-06-05 DIAGNOSIS — R972 Elevated prostate specific antigen [PSA]: Secondary | ICD-10-CM

## 2023-06-05 LAB — TESTOSTERONE: Testosterone: 414 ng/dL (ref 264–916)

## 2023-08-15 DIAGNOSIS — M79672 Pain in left foot: Secondary | ICD-10-CM | POA: Diagnosis not present

## 2023-08-26 ENCOUNTER — Other Ambulatory Visit: Payer: Self-pay | Admitting: Urology

## 2023-08-31 ENCOUNTER — Other Ambulatory Visit: Payer: Self-pay | Admitting: Nurse Practitioner

## 2023-08-31 DIAGNOSIS — I10 Essential (primary) hypertension: Secondary | ICD-10-CM

## 2023-09-02 NOTE — Telephone Encounter (Signed)
 Requested Prescriptions  Pending Prescriptions Disp Refills   amLODipine  (NORVASC ) 10 MG tablet [Pharmacy Med Name: amLODIPine  Besylate 10 MG Oral Tablet] 90 tablet 0    Sig: Take 1 tablet by mouth once daily     Cardiovascular: Calcium  Channel Blockers 2 Passed - 09/02/2023  3:01 PM      Passed - Last BP in normal range    BP Readings from Last 1 Encounters:  05/02/23 134/84         Passed - Last Heart Rate in normal range    Pulse Readings from Last 1 Encounters:  05/02/23 80         Passed - Valid encounter within last 6 months    Recent Outpatient Visits           4 months ago Chest pain, unspecified type   [facility] Quinton Buckler, FNP       Future Appointments             In 1 month Stoioff, Kizzie Perks, MD [facility] [place]

## 2023-09-05 ENCOUNTER — Other Ambulatory Visit

## 2023-09-06 DIAGNOSIS — M84375A Stress fracture, left foot, initial encounter for fracture: Secondary | ICD-10-CM | POA: Diagnosis not present

## 2023-09-06 DIAGNOSIS — M79672 Pain in left foot: Secondary | ICD-10-CM | POA: Diagnosis not present

## 2023-09-06 DIAGNOSIS — M778 Other enthesopathies, not elsewhere classified: Secondary | ICD-10-CM | POA: Diagnosis not present

## 2023-09-26 DIAGNOSIS — Z8709 Personal history of other diseases of the respiratory system: Secondary | ICD-10-CM | POA: Diagnosis not present

## 2023-09-26 DIAGNOSIS — Z87891 Personal history of nicotine dependence: Secondary | ICD-10-CM | POA: Diagnosis not present

## 2023-09-26 DIAGNOSIS — R053 Chronic cough: Secondary | ICD-10-CM | POA: Diagnosis not present

## 2023-10-04 ENCOUNTER — Other Ambulatory Visit: Payer: Self-pay | Admitting: Urology

## 2023-10-04 ENCOUNTER — Other Ambulatory Visit

## 2023-10-05 ENCOUNTER — Emergency Department
Admission: EM | Admit: 2023-10-05 | Discharge: 2023-10-05 | Disposition: A | Discharge status: Home / Self Care | Attending: Emergency Medicine | Admitting: Emergency Medicine

## 2023-10-05 ENCOUNTER — Other Ambulatory Visit: Payer: Self-pay

## 2023-10-05 ENCOUNTER — Emergency Department

## 2023-10-05 DIAGNOSIS — F17200 Nicotine dependence, unspecified, uncomplicated: Secondary | ICD-10-CM | POA: Insufficient documentation

## 2023-10-05 DIAGNOSIS — R0602 Shortness of breath: Secondary | ICD-10-CM | POA: Diagnosis present

## 2023-10-05 DIAGNOSIS — J441 Chronic obstructive pulmonary disease with (acute) exacerbation: Secondary | ICD-10-CM | POA: Diagnosis not present

## 2023-10-05 LAB — CBC
HCT: 45.8 % (ref 39.0–52.0)
Hemoglobin: 15.4 g/dL (ref 13.0–17.0)
MCH: 31 pg (ref 26.0–34.0)
MCHC: 33.6 g/dL (ref 30.0–36.0)
MCV: 92.3 fL (ref 80.0–100.0)
Platelets: 199 K/uL (ref 150–400)
RBC: 4.96 MIL/uL (ref 4.22–5.81)
RDW: 12.9 % (ref 11.5–15.5)
WBC: 9.5 K/uL (ref 4.0–10.5)
nRBC: 0 % (ref 0.0–0.2)

## 2023-10-05 LAB — BASIC METABOLIC PANEL WITH GFR
Anion gap: 10 (ref 5–15)
BUN: 18 mg/dL (ref 8–23)
CO2: 24 mmol/L (ref 22–32)
Calcium: 9.2 mg/dL (ref 8.9–10.3)
Chloride: 106 mmol/L (ref 98–111)
Creatinine, Ser: 0.77 mg/dL (ref 0.61–1.24)
GFR, Estimated: >60 mL/min (ref >60)
Glucose, Bld: 164 mg/dL — ABNORMAL HIGH (ref 70–99)
Potassium: 3.3 mmol/L — ABNORMAL LOW (ref 3.5–5.1)
Sodium: 140 mmol/L (ref 135–145)

## 2023-10-05 MED ORDER — PREDNISONE 20 MG PO TABS
60.0000 mg | ORAL_TABLET | Freq: Once | ORAL | Status: AC
  Administered 2023-10-05: 60 mg via ORAL
  Filled 2023-10-05: qty 3

## 2023-10-05 MED ORDER — IPRATROPIUM-ALBUTEROL 0.5-2.5 (3) MG/3ML IN SOLN
3.0000 mL | Freq: Once | RESPIRATORY_TRACT | Status: AC
  Administered 2023-10-05: 3 mL via RESPIRATORY_TRACT
  Filled 2023-10-05: qty 3

## 2023-10-05 MED ORDER — PREDNISONE 10 MG PO TABS
40.0000 mg | ORAL_TABLET | Freq: Every day | ORAL | 0 refills | Status: AC
Start: 2023-10-06 — End: 2023-10-10

## 2023-10-05 NOTE — Discharge Instructions (Addendum)
 I have sent steroids to your pharmacy.  Please take these as prescribed.  Please continue using your albuterol  inhaler at home and follow-up with your primary care provider for ongoing management of your COPD outpatient.  Please return for any severe or worsening symptoms with your breathing.

## 2023-10-05 NOTE — ED Provider Notes (Signed)
 [facility] Provider Note    Event Date/Time   First MD Initiated Contact with Patient 10/05/23 1410     (approximate)   History   Shortness of Breath (/)   HPI Michael Chaney is a 68 y.o. male with history of COPD, chronic tobacco use presenting today for shortness of breath.  Patient notes ongoing symptoms related to his COPD since he last went off his steroid treatment.  He intermittently uses albuterol  inhaler at home.  He also notes he is started smoking again.  Feels short of breath when he gets up and walks around.  Denies chest pain, cough, congestion, fever, chills, abdominal pain, nausea, vomiting, leg pain, leg swelling.  Chart review: Seen multiple times in the past several months related to smoking, bronchitis, and COPD     Physical Exam   Triage Vital Signs: ED Triage Vitals  Encounter Vitals Group     BP 10/05/23 1350 122/70     Girls Systolic BP Percentile --      Girls Diastolic BP Percentile --      Boys Systolic BP Percentile --      Boys Diastolic BP Percentile --      Pulse Rate 10/05/23 1350 (!) 101     Resp 10/05/23 1350 20     Temp 10/05/23 1350 98.1 F (36.7 C)     Temp src --      SpO2 10/05/23 1350 98 %     Weight 10/05/23 1346 170 lb (77.1 kg)     Height 10/05/23 1346 5' 11 (1.803 m)     Head Circumference --      Peak Flow --      Pain Score 10/05/23 1346 0     Pain Loc --      Pain Education --      Exclude from Growth Chart --     Most recent vital signs: Vitals:   10/05/23 1350  BP: 122/70  Pulse: (!) 101  Resp: 20  Temp: 98.1 F (36.7 C)  SpO2: 98%   I have reviewed the vital signs. General:  Awake, alert, no acute distress. Head:  Normocephalic, Atraumatic. EENT:  PERRL, EOMI, Oral mucosa pink and moist, Neck is supple. Cardiovascular: Regular rate, 2+ distal pulses. Respiratory:  Normal respiratory effort, symmetrical expansion, no distress.  Mild end expiratory wheeze present. Extremities:   Moving all four extremities through full ROM without pain.  No lower extremity edema. Neuro:  Alert and oriented.  Interacting appropriately.   Skin:  Warm, dry, no rash.   Psych: Appropriate affect.    ED Results / Procedures / Treatments   Labs (all labs ordered are listed, but only abnormal results are displayed) Labs Reviewed  BASIC METABOLIC PANEL WITH GFR - Abnormal; Notable for the following components:      Result Value   Potassium 3.3 (*)    Glucose, Bld 164 (*)    All other components within normal limits  CBC     EKG    RADIOLOGY Independently interpreted chest x-ray with no acute pathology   PROCEDURES:  Critical Care performed: No  Procedures   MEDICATIONS ORDERED IN ED: Medications  ipratropium-albuterol  (DUONEB) 0.5-2.5 (3) MG/3ML nebulizer solution 3 mL (3 mLs Nebulization Given 10/05/23 1438)  ipratropium-albuterol  (DUONEB) 0.5-2.5 (3) MG/3ML nebulizer solution 3 mL (3 mLs Nebulization Given 10/05/23 1438)  predniSONE  (DELTASONE ) tablet 60 mg (60 mg Oral Given 10/05/23 1437)     IMPRESSION / MDM / ASSESSMENT AND  PLAN / ED COURSE  I reviewed the triage vital signs and the nursing notes.                              Differential diagnosis includes, but is not limited to, bronchitis, tobacco abuse, COPD exacerbation, lower concern for pneumonia  Patient's presentation is most consistent with exacerbation of chronic illness.  Patient is a 67 year old male with history of COPD and tobacco use presenting today for shortness of breath.  Physical exam does not show any respiratory distress or tachypnea.  He has mild and expiratory wheeze but no other crackles present.  Initial vital signs showed mild tachycardia but not present once he was roomed.  CBC with no leukocytosis.  BMP with mild hypokalemia but otherwise unremarkable.  Chest x-ray negative.  Patient was given DuoNeb treatments along with prednisone .  After reassessment, he was feeling much better at  this time with minimal to no wheezing present anymore.  Suspect COPD triggered by recurrent tobacco use.  Otherwise stable for discharge with completion of 5-day course of prednisone .  Recommended smoking cessation and told to follow-up with PCP along with strict return precautions.  Clinical Course as of 10/05/23 1519  Sat Oct 05, 2023  1511 Patient reassessed with improvement in breathing symptoms.  Stable for discharge [DW]    Clinical Course User Index [DW] Malvina Alm DASEN, MD     FINAL CLINICAL IMPRESSION(S) / ED DIAGNOSES   Final diagnoses:  COPD exacerbation (HCC)     Rx / DC Orders   ED Discharge Orders          Ordered    predniSONE  (DELTASONE ) 10 MG tablet  Daily        10/05/23 1511             Note:  This document was prepared using Dragon voice recognition software and may include unintentional dictation errors.   Malvina Alm DASEN, MD 10/05/23 [phone]

## 2023-10-05 NOTE — ED Triage Notes (Signed)
 Pt to ED via POV from home. Pt ambulatory to triage. Pt reports SOB at baseline due to asthma but reports SOB worsened today.

## 2023-10-07 ENCOUNTER — Other Ambulatory Visit

## 2023-10-07 ENCOUNTER — Ambulatory Visit: Admitting: Urology

## 2023-10-07 DIAGNOSIS — R972 Elevated prostate specific antigen [PSA]: Secondary | ICD-10-CM | POA: Diagnosis not present

## 2023-10-07 DIAGNOSIS — E291 Testicular hypofunction: Secondary | ICD-10-CM | POA: Diagnosis not present

## 2023-10-08 LAB — HEMOGLOBIN AND HEMATOCRIT, BLOOD
Hematocrit: 46.6 % (ref 37.5–51.0)
Hemoglobin: 15.2 g/dL (ref 13.0–17.7)

## 2023-10-08 LAB — PSA: Prostate Specific Ag, Serum: 2.6 ng/mL (ref 0.0–4.0)

## 2023-10-08 LAB — TESTOSTERONE: Testosterone: 216 ng/dL — ABNORMAL LOW (ref 264–916)

## 2023-10-09 ENCOUNTER — Other Ambulatory Visit

## 2023-10-09 ENCOUNTER — Ambulatory Visit: Admitting: Urology

## 2023-10-10 ENCOUNTER — Ambulatory Visit (INDEPENDENT_AMBULATORY_CARE_PROVIDER_SITE_OTHER): Admitting: Nurse Practitioner

## 2023-10-10 ENCOUNTER — Encounter: Payer: Self-pay | Admitting: Nurse Practitioner

## 2023-10-10 VITALS — BP 126/78 | HR 96 | Temp 98.3°F | Resp 18 | Ht 71.0 in | Wt 178.3 lb

## 2023-10-10 DIAGNOSIS — Z0000 Encounter for general adult medical examination without abnormal findings: Secondary | ICD-10-CM

## 2023-10-10 DIAGNOSIS — Z9889 Other specified postprocedural states: Secondary | ICD-10-CM | POA: Diagnosis not present

## 2023-10-10 DIAGNOSIS — I10 Essential (primary) hypertension: Secondary | ICD-10-CM

## 2023-10-10 DIAGNOSIS — E782 Mixed hyperlipidemia: Secondary | ICD-10-CM | POA: Diagnosis not present

## 2023-10-10 DIAGNOSIS — Z8719 Personal history of other diseases of the digestive system: Secondary | ICD-10-CM | POA: Diagnosis not present

## 2023-10-10 DIAGNOSIS — R7303 Prediabetes: Secondary | ICD-10-CM | POA: Diagnosis not present

## 2023-10-10 DIAGNOSIS — R1032 Left lower quadrant pain: Secondary | ICD-10-CM

## 2023-10-10 LAB — LIPID PANEL
Cholesterol: 209 mg/dL — ABNORMAL HIGH (ref <200)
HDL: 75 mg/dL (ref >=40)
LDL Cholesterol (Calc): 106 mg/dL — ABNORMAL HIGH
Non-HDL Cholesterol (Calc): 134 mg/dL — ABNORMAL HIGH (ref <130)
Total CHOL/HDL Ratio: 2.8 (calc) (ref <5.0)
Triglycerides: 161 mg/dL — ABNORMAL HIGH (ref <150)

## 2023-10-10 LAB — COMPREHENSIVE METABOLIC PANEL WITH GFR
AG Ratio: 1.9 (calc) (ref 1.0–2.5)
ALT: 23 U/L (ref 9–46)
AST: 18 U/L (ref 10–35)
Albumin: 4.2 g/dL (ref 3.6–5.1)
Alkaline phosphatase (APISO): 65 U/L (ref 35–144)
BUN: 22 mg/dL (ref 7–25)
CO2: 26 mmol/L (ref 20–32)
Calcium: 9.8 mg/dL (ref 8.6–10.3)
Chloride: 104 mmol/L (ref 98–110)
Creat: 0.74 mg/dL (ref 0.70–1.35)
Globulin: 2.2 g/dL (ref 1.9–3.7)
Glucose, Bld: 96 mg/dL (ref 65–99)
Potassium: 3.5 mmol/L (ref 3.5–5.3)
Sodium: 139 mmol/L (ref 135–146)
Total Bilirubin: 0.5 mg/dL (ref 0.2–1.2)
Total Protein: 6.4 g/dL (ref 6.1–8.1)
eGFR: 99 mL/min/1.73m2 (ref >=60)

## 2023-10-10 LAB — HEMOGLOBIN A1C
Hgb A1c MFr Bld: 6.6 % — ABNORMAL HIGH (ref <5.7)
Mean Plasma Glucose: 143 mg/dL
eAG (mmol/L): 7.9 mmol/L

## 2023-10-10 NOTE — Progress Notes (Signed)
 Name: Michael Chaney   MRN: [id number]    DOB: 1955-04-22   Date:10/10/2023       Progress Note  Subjective  Chief Complaint  Patient here today for annual physical exam and left groin pain.    HPI  Patient presents for annual CPE and left groin pain.     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) Sleep: 8 hours  Last dental exam:2024 Last eye exam: 2025   Left Groin Pain: -Intermittent left groin pain for some time now -Denies urinary or bowel symptoms -Denies feeling any lumps/bumps/masses  -Reports hx of hernia repair  -Reports interested in CT scan to check for hernia   Depression: phq 9 is positive    05/02/2023    8:28 AM 10/30/2022    7:58 AM 05/03/2022   10:18 AM 06/08/2020    9:37 AM 06/01/2020    1:37 PM  Depression screen PHQ 2/9  Decreased Interest 0 0 0 0 0  Down, Depressed, Hopeless 1 0 0 0 1  PHQ - 2 Score 1 0 0 0 1  Altered sleeping 2   3 3   Tired, decreased energy 2   0 0  Change in appetite 0   1 3  Feeling bad or failure about yourself  1   0 3  Trouble concentrating 0   0 2  Moving slowly or fidgety/restless 0   0 0  Suicidal thoughts 1   0 0  PHQ-9 Score 7   4 12   Difficult doing work/chores    Not difficult at all Very difficult      05/02/2023    8:33 AM 06/08/2020    9:37 AM 06/01/2020    1:37 PM  GAD 7 : Generalized Anxiety Score  Nervous, Anxious, on Edge 1 0 0  Control/stop worrying 1 0 0  Worry too much - different things 1 0 0  Trouble relaxing 1 0 0  Restless 0 0 0  Easily annoyed or irritable 1 0 0  Afraid - awful might happen 1 0 0  Total GAD 7 Score 6 0 0  Anxiety Difficulty Somewhat difficult  Not difficult at all     Hypertension:  BP Readings from Last 3 Encounters:  10/10/23 126/78  10/05/23 122/70  05/02/23 134/84    Obesity: Wt Readings from Last 3 Encounters:  10/10/23 178 lb 4.8 oz (80.9 kg)  10/05/23 170 lb (77.1 kg)  05/02/23 175 lb (79.4 kg)   BMI Readings from Last  3 Encounters:  10/10/23 24.87 kg/m  10/05/23 23.71 kg/m  05/02/23 24.41 kg/m     Lipids:  Lab Results  Component Value Date   CHOL 173 05/03/2022   CHOL 248 (H) 03/02/2020   Lab Results  Component Value Date   HDL 68 05/03/2022   HDL 64 03/02/2020   Lab Results  Component Value Date   LDLCALC 85 05/03/2022   LDLCALC 170 (H) 03/02/2020   Lab Results  Component Value Date   TRIG 106 05/03/2022   TRIG 82 03/02/2020   Lab Results  Component Value Date   CHOLHDL 2.5 05/03/2022   CHOLHDL 3.9 03/02/2020   No results found for: LDLDIRECT Glucose:  Glucose, Bld  Date Value Ref Range Status  10/05/2023 164 (H) 70 - 99 mg/dL Final    Comment:    Glucose reference range applies only to samples taken after fasting for at least 8 hours.  04/25/2023 93 70 - 99  mg/dL Final    Comment:    Glucose reference range applies only to samples taken after fasting for at least 8 hours.  05/03/2022 93 65 - 99 mg/dL Final    Comment:    .            Fasting reference interval .     Flowsheet Row Office Visit from 10/30/2022 in [facility]  AUDIT-C Score 0    Married STD testing and prevention (HIV/chl/gon/syphilis): 03/09/2022 Hep C: 05/03/2022  Skin cancer: Discussed monitoring for atypical lesions Colorectal cancer: Cologuard 2 years ago  Prostate cancer: PSA checked on 10/07/2023 (2.6) Lab Results  Component Value Date   PSA 2.51 05/03/2022     Lung cancer:   Low Dose CT Chest recommended if Age 64-80 years, 30 pack-year currently smoking OR have quit w/in 15years. Patient does qualify.   AAA:  The USPSTF recommends one-time screening with ultrasonography in men ages 31 to 19 years who have ever smoked ECG:  10/07/2023  Vaccines:  HPV: up to at age 28 , ask insurance if age between 65-45  Shingrix: 72-64 yo and ask insurance if covered when patient above 51 yo Pneumonia:  educated and discussed with patient. Flu:  educated and discussed  with patient.  Advanced Care Planning: A voluntary discussion about advance care planning including the explanation and discussion of advance directives.  Discussed health care proxy and Living will, and the patient was able to identify a health care proxy as Wife Advertising copywriter).  Patient does not have a living will at present time. If patient does have living will, I have requested they bring this to the clinic to be scanned in to their chart.  Patient Active Problem List   Diagnosis Date Noted   Mild intermittent asthma without complication 05/03/2022   Aortic atherosclerosis (HCC) 05/03/2022   Pulmonary emphysema (HCC) 05/03/2022   HLD (hyperlipidemia) 03/09/2022   Lung nodule 03/09/2022   Erectile dysfunction 04/28/2020   Prediabetes 04/13/2020   Essential hypertension 03/02/2020   Smoking 03/02/2020   Encounter to establish care 03/02/2020    Past Surgical History:  Procedure Laterality Date   XI ROBOTIC ASSISTED INGUINAL HERNIA REPAIR WITH MESH Bilateral 09/28/2020   Procedure: XI ROBOTIC ASSISTED INGUINAL HERNIA REPAIR WITH MESH;  Surgeon: Rodolph Romano, MD;  Location: [facility] ORS;  Service: General;  Laterality: Bilateral;    Family History  Problem Relation Age of Onset   Hypertension Mother    Dementia Mother     Social History   Socioeconomic History   Marital status: Married    Spouse name: Not on file   Number of children: 7   Years of education: Not on file   Highest education level: Not on file  Occupational History   Not on file  Tobacco Use   Smoking status: Every Day    Current packs/day: 0.75    Average packs/day: 0.8 packs/day for 47.0 years (35.3 ttl pk-yrs)    Types: Cigarettes   Smokeless tobacco: Never  Vaping Use   Vaping status: Never Used  Substance and Sexual Activity   Alcohol use: Not Currently   Drug use: Yes    Types: Marijuana   Sexual activity: Yes  Other Topics Concern   Not on file  Social History Narrative   Not on file    Social Drivers of Health   Financial Resource Strain: Patient Declined (09/06/2023)   [facility]   Overall Financial Resource Strain (  CARDIA)    Difficulty of Paying Living Expenses: Patient declined  Food Insecurity: Patient Declined (09/06/2023)   Received from [facility]   Hunger Vital Sign    Within the past 12 months, you worried that your food would run out before you got the money to buy more.: Patient declined    Within the past 12 months, the food you bought just didn't last and you didn't have money to get more.: Patient declined  Transportation Needs: Patient Declined (09/06/2023)   Received from [facility] - Transportation    In the past 12 months, has lack of transportation kept you from medical appointments or from getting medications?: Patient declined    Lack of Transportation (Non-Medical): Patient declined  Physical Activity: Inactive (10/10/2023)   Exercise Vital Sign    Days of Exercise per Week: 0 days    Minutes of Exercise per Session: 0 min  Stress: No Stress Concern Present (10/10/2023)   [employer] of Occupational Health - Occupational Stress Questionnaire    Feeling of Stress: Not at all  Social Connections: Socially Isolated (10/10/2023)   Social Connection and Isolation Panel    Frequency of Communication with Friends and Family: Never    Frequency of Social Gatherings with Friends and Family: Never    Attends Religious Services: Never    Database administrator or Organizations: No    Attends Banker Meetings: Never    Marital Status: Married  Catering manager Violence: Not At Risk (10/10/2023)   Humiliation, Afraid, Rape, and Kick questionnaire    Fear of Current or Ex-Partner: No    Emotionally Abused: No    Physically Abused: No    Sexually Abused: No     Current Outpatient Medications:    albuterol  (VENTOLIN  HFA) 108 (90 Base) MCG/ACT inhaler,  Inhale 1-2 puffs into the lungs every 4 (four) hours as needed for wheezing or shortness of breath., Disp: 18 g, Rfl: 3   amLODipine  (NORVASC ) 10 MG tablet, Take 1 tablet by mouth once daily, Disp: 90 tablet, Rfl: 0   fluticasone-salmeterol (ADVAIR) 100-50 MCG/ACT AEPB, Inhale 1 puff into the lungs 2 (two) times daily., Disp: , Rfl:    oxybutynin  (DITROPAN -XL) 10 MG 24 hr tablet, Take 1 tablet (10 mg total) by mouth daily., Disp: 30 tablet, Rfl: 0   predniSONE  (DELTASONE ) 10 MG tablet, Take 4 tablets (40 mg total) by mouth daily for 4 days., Disp: 16 tablet, Rfl: 0   sildenafil  (VIAGRA ) 100 MG tablet, TAKE 1 TABLET BY MOUTH 1 HOUR PRIOR TO INTERCOURSE, Disp: 30 tablet, Rfl: 0   Testosterone  20.25 MG/ACT (1.62%) GEL, 1 pump applied to each shoulder daily, Disp: 75 g, Rfl: 1  Allergies  Allergen Reactions   Bee Venom Swelling     ROS  Constitutional: Negative for fever or weight change.  Respiratory: Negative for cough and shortness of breath.   Cardiovascular: Negative for chest pain or palpitations.  Gastrointestinal: Reports intermittent left groin pain, no bowel changes.  Musculoskeletal: Negative for gait problem or joint swelling.  Skin: Negative for rash.  Neurological: Negative for dizziness or headache.  No other specific complaints in a complete review of systems (except as listed in HPI above).    Objective  Vitals:   10/10/23 0907  BP: 126/78  Pulse: 96  Resp: 18  Temp: 98.3 F (36.8 C)  SpO2: 96%  Weight: 178 lb 4.8 oz (80.9 kg)  Height: 5' 11 (1.803  m)    Body mass index is 24.87 kg/m. Waist Measurement : 36 inches  Physical Exam Constitutional:      Appearance: Normal appearance.  HENT:     Head: Normocephalic and atraumatic.     Right Ear: Tympanic membrane normal.     Left Ear: Tympanic membrane normal.     Mouth/Throat:     Mouth: Mucous membranes are moist.  Eyes:     Pupils: Pupils are equal, round, and reactive to light.  Cardiovascular:      Rate and Rhythm: Normal rate and regular rhythm.     Pulses: Normal pulses.     Heart sounds: Normal heart sounds.  Pulmonary:     Effort: Pulmonary effort is normal.     Breath sounds: Normal breath sounds.  Abdominal:     General: Abdomen is flat. Bowel sounds are normal.     Palpations: Abdomen is soft.     Tenderness: There is no abdominal tenderness. There is no guarding.  Skin:    General: Skin is warm and dry.  Neurological:     General: No focal deficit present.     Mental Status: He is alert.  Psychiatric:        Mood and Affect: Mood normal.        Behavior: Behavior normal.        Thought Content: Thought content normal.        Judgment: Judgment normal.      Recent Results (from the past 2160 hours)  Basic metabolic panel     Status: Abnormal   Collection Time: 10/05/23  1:47 PM  Result Value Ref Range   Sodium 140 135 - 145 mmol/L   Potassium 3.3 (L) 3.5 - 5.1 mmol/L   Chloride 106 98 - 111 mmol/L   CO2 24 22 - 32 mmol/L   Glucose, Bld 164 (H) 70 - 99 mg/dL    Comment: Glucose reference range applies only to samples taken after fasting for at least 8 hours.   BUN 18 8 - 23 mg/dL   Creatinine, Ser 9.22 0.61 - 1.24 mg/dL   Calcium  9.2 8.9 - 10.3 mg/dL   GFR, Estimated >39 >39 mL/min    Comment: (NOTE) Calculated using the CKD-EPI Creatinine Equation (2021)    Anion gap 10 5 - 15    Comment: Performed at [facility], [address] [place], [place] [address]  CBC     Status: None   Collection Time: 10/05/23  1:47 PM  Result Value Ref Range   WBC 9.5 4.0 - 10.5 K/uL   RBC 4.96 4.22 - 5.81 MIL/uL   Hemoglobin 15.4 13.0 - 17.0 g/dL   HCT 54.1 60.9 - 47.9 %   MCV 92.3 80.0 - 100.0 fL   MCH 31.0 26.0 - 34.0 pg   MCHC 33.6 30.0 - 36.0 g/dL   RDW 87.0 88.4 - 84.4 %   Platelets 199 150 - 400 K/uL   nRBC 0.0 0.0 - 0.2 %    Comment: Performed at [facility], [address] [place], [place] [address]  Testosterone      Status: Abnormal    Collection Time: 10/07/23 11:47 AM  Result Value Ref Range   Testosterone  216 (L) 264 - 916 ng/dL    Comment: Adult male reference interval is based on a population of healthy nonobese males (BMI <30) between 73 and 5 years old. Travison, et.al. JCEM [phone]. PMID: [id number].   PSA     Status:  None   Collection Time: 10/07/23 11:47 AM  Result Value Ref Range   Prostate Specific Ag, Serum 2.6 0.0 - 4.0 ng/mL    Comment: Roche ECLIA methodology. According to the American Urological Association, Serum PSA should decrease and remain at undetectable levels after radical prostatectomy. The AUA defines biochemical recurrence as an initial PSA value 0.2 ng/mL or greater followed by a subsequent confirmatory PSA value 0.2 ng/mL or greater. Values obtained with different assay methods or kits cannot be used interchangeably. Results cannot be interpreted as absolute evidence of the presence or absence of malignant disease.   Hemoglobin and Hematocrit, Blood     Status: None   Collection Time: 10/07/23 11:47 AM  Result Value Ref Range   Hemoglobin 15.2 13.0 - 17.7 g/dL   Hematocrit 53.3 62.4 - 51.0 %     Fall Risk:    05/02/2023    8:34 AM 10/30/2022    7:58 AM 05/03/2022   10:18 AM  Fall Risk   Falls in the past year? 0 0 0  Number falls in past yr: 0 0 0  Injury with Fall? 0 0 0  Follow up Falls evaluation completed        Functional Status Survey: Is the patient deaf or have difficulty hearing?: No Does the patient have difficulty seeing, even when wearing glasses/contacts?: No Does the patient have difficulty concentrating, remembering, or making decisions?: No Does the patient have difficulty walking or climbing stairs?: No Does the patient have difficulty dressing or bathing?: No Does the patient have difficulty doing errands alone such as visiting a doctor's office or shopping?: No    Assessment & Plan  1. Annual physical exam (Primary)  - Comprehensive  metabolic panel with GFR - Lipid panel - Hemoglobin A1c  2. Essential hypertension - Comprehensive metabolic panel with GFR - Lipid panel  3. Prediabetes  - Hemoglobin A1c  4. Mixed hyperlipidemia  - Lipid panel  5. Groin discomfort, left  - CT ABDOMEN PELVIS WO CONTRAST; Future  6. Hx of hernia repair  - CT ABDOMEN PELVIS WO CONTRAST; Future     -Prostate cancer screening and PSA options (with potential risks and benefits of testing vs not testing) were discussed along with recent recs/guidelines. -USPSTF grade A and B recommendations reviewed with patient; age-appropriate recommendations, preventive care, screening tests, etc discussed and encouraged; healthy living encouraged; see AVS for patient education given to patient -Discussed importance of 150 minutes of physical activity weekly, eat two servings of fish weekly, eat one serving of tree nuts ( cashews, pistachios, pecans, almonds.SABRA) every other day, eat 6 servings of fruit/vegetables daily and drink plenty of water  and avoid sweet beverages.  -Reviewed Health Maintenance: YES   I have reviewed this encounter including the documentation in this note and/or discussed this patient with the provider, Aislinn Womack, SNP, I am certifying that I agree with the content of this note as supervising/preceptor nurse practitioner.  Mliss Spray, FNP-C [facility] 10/10/2023, 10:25 AM

## 2023-10-11 ENCOUNTER — Ambulatory Visit: Admitting: Urology

## 2023-10-11 ENCOUNTER — Ambulatory Visit: Payer: Self-pay | Admitting: Nurse Practitioner

## 2023-10-14 ENCOUNTER — Other Ambulatory Visit: Payer: Self-pay

## 2023-10-14 ENCOUNTER — Ambulatory Visit
Admission: RE | Admit: 2023-10-14 | Discharge: 2023-10-14 | Disposition: A | Discharge status: Home / Self Care | Source: Ambulatory Visit | Attending: Nurse Practitioner | Admitting: Nurse Practitioner

## 2023-10-14 ENCOUNTER — Emergency Department

## 2023-10-14 DIAGNOSIS — Z8719 Personal history of other diseases of the digestive system: Secondary | ICD-10-CM | POA: Diagnosis not present

## 2023-10-14 DIAGNOSIS — Z720 Tobacco use: Secondary | ICD-10-CM | POA: Diagnosis not present

## 2023-10-14 DIAGNOSIS — R1032 Left lower quadrant pain: Secondary | ICD-10-CM | POA: Diagnosis not present

## 2023-10-14 DIAGNOSIS — J4521 Mild intermittent asthma with (acute) exacerbation: Secondary | ICD-10-CM | POA: Insufficient documentation

## 2023-10-14 DIAGNOSIS — Z79899 Other long term (current) drug therapy: Secondary | ICD-10-CM | POA: Insufficient documentation

## 2023-10-14 DIAGNOSIS — I10 Essential (primary) hypertension: Secondary | ICD-10-CM | POA: Diagnosis not present

## 2023-10-14 DIAGNOSIS — Z9889 Other specified postprocedural states: Secondary | ICD-10-CM | POA: Diagnosis not present

## 2023-10-14 DIAGNOSIS — R1030 Lower abdominal pain, unspecified: Secondary | ICD-10-CM | POA: Diagnosis not present

## 2023-10-14 DIAGNOSIS — R0602 Shortness of breath: Secondary | ICD-10-CM | POA: Diagnosis not present

## 2023-10-14 LAB — CBC
HCT: 47.2 % (ref 39.0–52.0)
Hemoglobin: 15.7 g/dL (ref 13.0–17.0)
MCH: 30.3 pg (ref 26.0–34.0)
MCHC: 33.3 g/dL (ref 30.0–36.0)
MCV: 90.9 fL (ref 80.0–100.0)
Platelets: 254 K/uL (ref 150–400)
RBC: 5.19 MIL/uL (ref 4.22–5.81)
RDW: 13.1 % (ref 11.5–15.5)
WBC: 11.8 K/uL — ABNORMAL HIGH (ref 4.0–10.5)
nRBC: 0 % (ref 0.0–0.2)

## 2023-10-14 NOTE — ED Triage Notes (Addendum)
 Pt to ED via POV c/o SOB. Pt reports it started earlier today. Worse when he lays down. Was seen here a week ago for same. Was prescribed prednisone  and says that it really helped him. Pt has been using inhalers and nebs with minimal relief. Denies CP, fevers, dizziness. Hx COPD

## 2023-10-15 ENCOUNTER — Emergency Department
Admission: EM | Admit: 2023-10-15 | Discharge: 2023-10-15 | Disposition: A | Discharge status: Home / Self Care | Attending: Emergency Medicine | Admitting: Emergency Medicine

## 2023-10-15 DIAGNOSIS — J4521 Mild intermittent asthma with (acute) exacerbation: Secondary | ICD-10-CM

## 2023-10-15 DIAGNOSIS — R0602 Shortness of breath: Secondary | ICD-10-CM | POA: Diagnosis not present

## 2023-10-15 LAB — BASIC METABOLIC PANEL WITH GFR
Anion gap: 11 (ref 5–15)
BUN: 19 mg/dL (ref 8–23)
CO2: 23 mmol/L (ref 22–32)
Calcium: 9.8 mg/dL (ref 8.9–10.3)
Chloride: 108 mmol/L (ref 98–111)
Creatinine, Ser: 0.83 mg/dL (ref 0.61–1.24)
GFR, Estimated: >60 mL/min (ref >60)
Glucose, Bld: 183 mg/dL — ABNORMAL HIGH (ref 70–99)
Potassium: 4.1 mmol/L (ref 3.5–5.1)
Sodium: 142 mmol/L (ref 135–145)

## 2023-10-15 LAB — TROPONIN I (HIGH SENSITIVITY)
Troponin I (High Sensitivity): 4 ng/L (ref <18)
Troponin I (High Sensitivity): 4 ng/L (ref <18)

## 2023-10-15 LAB — RESP PANEL BY RT-PCR (RSV, FLU A&B, COVID)  RVPGX2
Influenza A by PCR: NEGATIVE
Influenza B by PCR: NEGATIVE
Resp Syncytial Virus by PCR: NEGATIVE
SARS Coronavirus 2 by RT PCR: NEGATIVE

## 2023-10-15 LAB — BRAIN NATRIURETIC PEPTIDE: B Natriuretic Peptide: 9.2 pg/mL (ref 0.0–100.0)

## 2023-10-15 MED ORDER — PREDNISONE 20 MG PO TABS
60.0000 mg | ORAL_TABLET | Freq: Once | ORAL | Status: AC
  Administered 2023-10-15: 60 mg via ORAL
  Filled 2023-10-15: qty 3

## 2023-10-15 MED ORDER — IPRATROPIUM-ALBUTEROL 0.5-2.5 (3) MG/3ML IN SOLN
3.0000 mL | RESPIRATORY_TRACT | Status: DC
  Administered 2023-10-15: 3 mL via RESPIRATORY_TRACT
  Filled 2023-10-15: qty 3

## 2023-10-15 MED ORDER — PREDNISONE 10 MG PO TABS: 60.0000 mg | ORAL_TABLET | ORAL | 0 refills | Status: DC

## 2023-10-15 MED ORDER — IPRATROPIUM-ALBUTEROL 0.5-2.5 (3) MG/3ML IN SOLN
3.0000 mL | Freq: Once | RESPIRATORY_TRACT | Status: AC
  Administered 2023-10-15: 3 mL via RESPIRATORY_TRACT
  Filled 2023-10-15: qty 3

## 2023-10-15 NOTE — ED Notes (Signed)
 Pt taken to rm 15 by this EDT. Pt placed on 5 lead monitor. Upon entering room, pt noted to be wheezing badly. Pt immediately hooked up yo pulse ox to check O2 level. Pt at 100% on room air. Pt requesting another nebulizer treatment and wants to talk to the DR. Bed locked and in lowest position. Call light within reach. Margeaux, RN and Dr. Neomi notified that pt is in room at this time.

## 2023-10-15 NOTE — Discharge Instructions (Signed)
 Please continue your Advair, albuterol  as prescribed.  Please follow-up with your pulmonologist.

## 2023-10-15 NOTE — ED Provider Notes (Signed)
 [facility] Provider Note    Event Date/Time   First MD Initiated Contact with Patient 10/15/23 0410     (approximate)   History   Shortness of Breath   HPI  Michael Chaney is a 68 y.o. male with history of hypertension, hyperlipidemia, asthma, prior tobacco use who presents to the emergency department with shortness of breath, wheezing.  Feels like previous asthma exacerbations.  No history of PE, DVT.  No fevers or productive cough.  Patient was feeling better after breathing treatment but states symptoms have returned.  Does report he recently finished a prednisone  burst.   History provided by patient.    Past Medical History:  Diagnosis Date   Asthma    Dyspnea    Hyperlipidemia    Hypertension     Past Surgical History:  Procedure Laterality Date   XI ROBOTIC ASSISTED INGUINAL HERNIA REPAIR WITH MESH Bilateral 09/28/2020   Procedure: XI ROBOTIC ASSISTED INGUINAL HERNIA REPAIR WITH MESH;  Surgeon: Rodolph Romano, MD;  Location: [facility] ORS;  Service: General;  Laterality: Bilateral;    MEDICATIONS:  Prior to Admission medications   Medication Sig Start Date End Date Taking? Authorizing Provider  albuterol  (VENTOLIN  HFA) 108 (90 Base) MCG/ACT inhaler Inhale 1-2 puffs into the lungs every 4 (four) hours as needed for wheezing or shortness of breath. 10/30/22   Pender, Julie F, FNP  amLODipine  (NORVASC ) 10 MG tablet Take 1 tablet by mouth once daily 09/02/23   Pender, Julie F, FNP  fluticasone-salmeterol (ADVAIR) 100-50 MCG/ACT AEPB Inhale 1 puff into the lungs 2 (two) times daily.    [provider]  oxybutynin  (DITROPAN -XL) 10 MG 24 hr tablet Take 1 tablet (10 mg total) by mouth daily. 03/26/23   Vaillancourt, Samantha, PA-C  sildenafil  (VIAGRA ) 100 MG tablet TAKE 1 TABLET BY MOUTH 1 HOUR PRIOR TO INTERCOURSE 10/04/23   Stoioff, Glendia BROCKS, MD  Testosterone  20.25 MG/ACT (1.62%) GEL 1 pump applied to each shoulder daily 03/25/23    Maurine Lukes, PA-C    Physical Exam   Triage Vital Signs: ED Triage Vitals  Encounter Vitals Group     BP 10/14/23 2338 (!) 140/75     Girls Systolic BP Percentile --      Girls Diastolic BP Percentile --      Boys Systolic BP Percentile --      Boys Diastolic BP Percentile --      Pulse Rate 10/14/23 2338 99     Resp 10/14/23 2338 18     Temp 10/14/23 2338 98.5 F (36.9 C)     Temp Source 10/14/23 2338 Oral     SpO2 10/14/23 2338 95 %     Weight --      Height --      Head Circumference --      Peak Flow --      Pain Score 10/14/23 2339 0     Pain Loc --      Pain Education --      Exclude from Growth Chart --     Most recent vital signs: Vitals:   10/15/23 0415 10/15/23 0430  BP: (!) 159/101 93/77  Pulse:  76  Resp: 20 (!) 22  Temp:    SpO2: 99% 100%    CONSTITUTIONAL: Alert, responds appropriately to questions. Well-appearing; well-nourished HEAD: Normocephalic, atraumatic EYES: Conjunctivae clear, pupils appear equal, sclera nonicteric ENT: normal nose; moist mucous membranes NECK: Supple, normal ROM CARD: RRR; S1 and S2 appreciated RESP: Normal  chest excursion without splinting or tachypnea; breath sounds clear and equal bilaterally; no wheezes, no rhonchi, no rales, no hypoxia or respiratory distress, speaking full sentences ABD/GI: Non-distended; soft, non-tender, no rebound, no guarding, no peritoneal signs BACK: The back appears normal EXT: Normal ROM in all joints; no deformity noted, no edema, no calf tenderness or calf swelling SKIN: Normal color for age and race; warm; no rash on exposed skin NEURO: Moves all extremities equally, normal speech PSYCH: The patient's mood and manner are appropriate.   ED Results / Procedures / Treatments   LABS: (all labs ordered are listed, but only abnormal results are displayed) Labs Reviewed  BASIC METABOLIC PANEL WITH GFR - Abnormal; Notable for the following components:      Result Value    Glucose, Bld 183 (*)    All other components within normal limits  CBC - Abnormal; Notable for the following components:   WBC 11.8 (*)    All other components within normal limits  RESP PANEL BY RT-PCR (RSV, FLU A&B, COVID)  RVPGX2  BRAIN NATRIURETIC PEPTIDE  TROPONIN I (HIGH SENSITIVITY)  TROPONIN I (HIGH SENSITIVITY)     EKG:  EKG Interpretation Date/Time:  Monday October 14 2023 23:44:18 EDT Ventricular Rate:  96 PR Interval:  132 QRS Duration:  76 QT Interval:  338 QTC Calculation: 427 R Axis:   70  Text Interpretation: Sinus rhythm with Premature atrial complexes Otherwise normal ECG When compared with ECG of 05-Oct-2023 13:48, No significant change was found Confirmed by Neomi Neptune [phone]) on 10/15/2023 6:55:57 AM         RADIOLOGY: My personal review and interpretation of imaging: Chest x-ray clear.  I have personally reviewed all radiology reports.   DG Chest 2 View Result Date: 10/15/2023 CLINICAL DATA:  SOB EXAM: CHEST - 2 VIEW COMPARISON:  Chest x-ray 10/04/2013 FINDINGS: The heart and mediastinal contours are unchanged. Atherosclerotic plaque. No focal consolidation. No pulmonary edema. No pleural effusion. No pneumothorax. No acute osseous abnormality. IMPRESSION: No active cardiopulmonary disease. Electronically Signed   By: Morgane  Naveau M.D.   On: 10/15/2023 00:04     PROCEDURES:  Critical Care performed: No   Procedures    IMPRESSION / MDM / ASSESSMENT AND PLAN / ED COURSE  I reviewed the triage vital signs and the nursing notes.    Patient here with shortness of breath, wheezing.  History of asthma.   DIFFERENTIAL DIAGNOSIS (includes but not limited to):   Asthma exacerbation, viral URI, pneumonia, less likely ACS, PE, pneumothorax, CHF   Patient's presentation is most consistent with acute presentation with potential threat to life or bodily function.   PLAN: Will give breathing treatment, steroids.  Labs obtained from triage show  normal hemoglobin, electrolytes.  Negative troponin, normal BNP.  COVID, flu and RSV negative.  Chest x-ray reviewed and interpreted by myself and the radiologist and is clear.  Does have a mild leukocytosis likely from recent steroid use.   MEDICATIONS GIVEN IN ED: Medications  ipratropium-albuterol  (DUONEB) 0.5-2.5 (3) MG/3ML nebulizer solution 3 mL (3 mLs Nebulization Not Given 10/15/23 0512)  ipratropium-albuterol  (DUONEB) 0.5-2.5 (3) MG/3ML nebulizer solution 3 mL (3 mLs Nebulization Given 10/15/23 0147)  predniSONE  (DELTASONE ) tablet 60 mg (60 mg Oral Given 10/15/23 0429)     ED COURSE: Patient reports feeling better after additional breathing treatment.  He is comfortable with plan for discharge home.  Has albuterol , Advair.  Will discharge on prolonged steroid taper.   CONSULTS:  none  OUTSIDE RECORDS REVIEWED: Reviewed prior pulmonology notes.       FINAL CLINICAL IMPRESSION(S) / ED DIAGNOSES   Final diagnoses:  Exacerbation of intermittent asthma, unspecified asthma severity     Rx / DC Orders   ED Discharge Orders          Ordered    predniSONE  (DELTASONE ) 10 MG tablet  As directed        10/15/23 9493             Note:  This document was prepared using Dragon voice recognition software and may include unintentional dictation errors.   Stavros Cail, Josette SAILOR, DO 10/15/23 [phone]

## 2023-10-21 ENCOUNTER — Other Ambulatory Visit: Payer: Self-pay | Admitting: Nurse Practitioner

## 2023-10-21 DIAGNOSIS — E782 Mixed hyperlipidemia: Secondary | ICD-10-CM

## 2023-10-21 MED ORDER — ROSUVASTATIN CALCIUM 10 MG PO TABS: 10.0000 mg | ORAL_TABLET | Freq: Every day | ORAL | 1 refills | Status: AC

## 2023-10-26 ENCOUNTER — Other Ambulatory Visit: Payer: Self-pay | Admitting: Physician Assistant

## 2023-10-26 DIAGNOSIS — E291 Testicular hypofunction: Secondary | ICD-10-CM

## 2023-10-29 ENCOUNTER — Other Ambulatory Visit: Payer: Self-pay | Admitting: Urology

## 2023-11-05 ENCOUNTER — Ambulatory Visit: Admitting: Urology

## 2023-11-05 VITALS — BP 120/66 | HR 69 | Ht 71.0 in | Wt 178.0 lb

## 2023-11-05 DIAGNOSIS — E291 Testicular hypofunction: Secondary | ICD-10-CM | POA: Diagnosis not present

## 2023-11-05 DIAGNOSIS — N5201 Erectile dysfunction due to arterial insufficiency: Secondary | ICD-10-CM | POA: Diagnosis not present

## 2023-11-05 NOTE — Progress Notes (Signed)
 11/05/2023 3:57 PM   Erle Minerva [birth year] [id number]  Referring provider: Gareth Mliss FALCON, FNP [address] [place],  [place] [address]  Chief Complaint  Patient presents with   Erectile Dysfunction    Urologic History: 1. Erectile Dysfunction Organic risk factors include hyperlipidemia, hypertension and antihypertensive medications 35 pack-year tobacco history and current smoker at 1 pack/day Trial sildenafil  with improvement in erections  2.  Hypogonadism Testosterone  1.60% 0   HPI: Michael Chaney is a 68 y.o. male who presents for follow-up.  At last visit testosterone  level was 284 ng/dL and he elected to increase gel dose to 4 pumps daily Follow-up testosterone  level was 414 He has continued to apply gel and has seen no improvement in his ED with sildenafil  100 mg.  He states he has taken up to 300 mg without improvement Labs 10/07/2023: Testosterone  216 ng/dL, Y/Y84.7/53.3, PSA 2.6   PMH: Past Medical History:  Diagnosis Date   Asthma    Dyspnea    Hyperlipidemia    Hypertension     Surgical History: Past Surgical History:  Procedure Laterality Date   XI ROBOTIC ASSISTED INGUINAL HERNIA REPAIR WITH MESH Bilateral 09/28/2020   Procedure: XI ROBOTIC ASSISTED INGUINAL HERNIA REPAIR WITH MESH;  Surgeon: Rodolph Romano, MD;  Location: [facility] ORS;  Service: General;  Laterality: Bilateral;    Home Medications:  Allergies as of 11/05/2023       Reactions   Bee Venom Swelling        Medication List        Accurate as of November 05, 2023  3:57 PM. If you have any questions, ask your nurse or doctor.          STOP taking these medications    oxybutynin  10 MG 24 hr tablet Commonly known as: DITROPAN -XL   predniSONE  10 MG tablet Commonly known as: DELTASONE        TAKE these medications    albuterol  108 (90 Base) MCG/ACT inhaler Commonly known as: VENTOLIN  HFA Inhale 1-2 puffs into the lungs every 4 (four) hours  as needed for wheezing or shortness of breath.   amLODipine  10 MG tablet Commonly known as: NORVASC  Take 1 tablet by mouth once daily   fluticasone-salmeterol 100-50 MCG/ACT Aepb Commonly known as: ADVAIR Inhale 1 puff into the lungs 2 (two) times daily.   rosuvastatin  10 MG tablet Commonly known as: CRESTOR  Take 1 tablet (10 mg total) by mouth daily.   sildenafil  100 MG tablet Commonly known as: VIAGRA  TAKE 1 TABLET BY MOUTH 1 HOUR PRIOR TO INTERCOURSE   Testosterone  1.62 % Gel APPLY 1 PUMP TO EACH SHOULDER DAILY        Allergies:  Allergies  Allergen Reactions   Bee Venom Swelling    Family History: Family History  Problem Relation Age of Onset   Hypertension Mother    Dementia Mother     Social History:  reports that he has been smoking cigarettes. He has a 35.3 pack-year smoking history. He has never used smokeless tobacco. He reports that he does not currently use alcohol. He reports current drug use. Drug: Marijuana.   Physical Exam: BP 120/66   Pulse 69   Ht 5' 11 (1.803 m)   Wt 178 lb (80.7 kg)   BMI 24.83 kg/m   Constitutional:  Alert and oriented, No acute distress. HEENT: [place] AT Respiratory: Normal respiratory effort, no increased work of breathing. Psychiatric: Normal mood and affect.   Assessment & Plan:  1. Hypogonadism in male Testosterone  level is abnormally low.  He may not be absorbing the gel however his level was up in the 400 range after increasing the dose.  He just got a refill and will recheck a level in 1 month  2.  Erectile dysfunction PDE 5 inhibitor refractory   Glendia JAYSON Barba, MD  [facility] [address] [place], [place] [address] [phone]

## 2023-11-06 ENCOUNTER — Other Ambulatory Visit: Payer: Self-pay | Admitting: Nurse Practitioner

## 2023-11-06 ENCOUNTER — Encounter: Payer: Self-pay | Admitting: Urology

## 2023-11-06 DIAGNOSIS — J4520 Mild intermittent asthma, uncomplicated: Secondary | ICD-10-CM

## 2023-11-08 ENCOUNTER — Other Ambulatory Visit: Payer: Self-pay | Admitting: Nurse Practitioner

## 2023-11-08 DIAGNOSIS — J4520 Mild intermittent asthma, uncomplicated: Secondary | ICD-10-CM

## 2023-11-08 NOTE — Telephone Encounter (Signed)
 Requested Prescriptions  Pending Prescriptions Disp Refills   VENTOLIN  HFA 108 (90 Base) MCG/ACT inhaler [Pharmacy Med Name: Ventolin  HFA 108 (90 Base) MCG/ACT Inhalation Aerosol Solution] 18 g 3    Sig: INHALE 1 TO 2 PUFFS BY MOUTH EVERY 4 HOURS AS NEEDED FOR WHEEZING OR SHORTNESS OF BREATH     Pulmonology:  Beta Agonists 2 Passed - 11/08/2023  2:02 PM      Passed - Last BP in normal range    BP Readings from Last 1 Encounters:  11/05/23 120/66         Passed - Last Heart Rate in normal range    Pulse Readings from Last 1 Encounters:  11/05/23 69         Passed - Valid encounter within last 12 months    Recent Outpatient Visits           4 weeks ago Annual physical exam   [facility] Gareth Mliss FALCON, FNP   6 months ago Chest pain, unspecified type   [facility] Gareth Mliss FALCON, FNP       Future Appointments             In 5 months Gareth, Mliss FALCON, FNP [facility], [facility]

## 2023-11-08 NOTE — Telephone Encounter (Unsigned)
 Copied from CRM [id number]. Topic: Clinical - Medication Refill >> Nov 08, 2023 11:04 AM Marissa P wrote: Medication: albuterol  (VENTOLIN  HFA) 108 (90 Base) MCG/ACT inhaler  Has the patient contacted their pharmacy? Yes (Agent: If no, request that the patient contact the pharmacy for the refill. If patient does not wish to contact the pharmacy document the reason why and proceed with request.) (Agent: If yes, when and what did the pharmacy advise?)  This is the patient's preferred pharmacy:  [facility] [address]), [place] - [address] [place]) [place] [address] Phone: [phone] Fax: [phone]  Is this the correct pharmacy for this prescription? Yes If no, delete pharmacy and type the correct one.   Has the prescription been filled recently? Yes  Is the patient out of the medication? Yes, last one that was picked up is not working, has none right now   Has the patient been seen for an appointment in the last year OR does the patient have an upcoming appointment? Yes  Can we respond through MyChart? Unsure, pharmacist called   Agent: Please be advised that Rx refills may take up to 3 business days. We ask that you follow-up with your pharmacy.

## 2023-11-12 NOTE — Telephone Encounter (Signed)
 Duplicate request, LRF 11/11/23.  Requested Prescriptions  Pending Prescriptions Disp Refills   VENTOLIN  HFA 108 (90 Base) MCG/ACT inhaler 18 g 3     Pulmonology:  Beta Agonists 2 Passed - 11/12/2023  8:48 AM      Passed - Last BP in normal range    BP Readings from Last 1 Encounters:  11/05/23 120/66         Passed - Last Heart Rate in normal range    Pulse Readings from Last 1 Encounters:  11/05/23 69         Passed - Valid encounter within last 12 months    Recent Outpatient Visits           1 month ago Annual physical exam   [facility] Gareth Mliss FALCON, FNP   6 months ago Chest pain, unspecified type   [facility] Gareth Mliss FALCON, FNP       Future Appointments             In 5 months Gareth, Mliss FALCON, FNP [facility], [facility]

## 2023-11-13 ENCOUNTER — Ambulatory Visit: Payer: Self-pay

## 2023-11-13 DIAGNOSIS — J441 Chronic obstructive pulmonary disease with (acute) exacerbation: Secondary | ICD-10-CM | POA: Diagnosis not present

## 2023-11-13 DIAGNOSIS — R0602 Shortness of breath: Secondary | ICD-10-CM | POA: Diagnosis not present

## 2023-11-13 NOTE — Telephone Encounter (Signed)
 Patient is a [facility] pulmonary patient-given phone number for pulmonary to call with his concerns  Called Nurse Triage reporting Shortness of Breath.  Symptoms began several weeks ago.  Interventions attempted: Rescue inhaler and Maintenance inhaler.  Symptoms are: unchanged.  Triage Disposition: See HCP Within 4 Hours (Or PCP Triage)  Patient/caregiver understands and will follow disposition?: No, wishes to speak with PCP  Copied from CRM #[id number]. Topic: Clinical - Red Word Triage >> Nov 13, 2023  2:35 PM Michael Chaney wrote: Red Word that prompted transfer to Nurse Triage: SOB/ advair not working Reason for Disposition  [1] Longstanding difficulty breathing AND [2] not responding to usual therapy  Answer Assessment - Initial Assessment Questions 1. RESPIRATORY STATUS: Describe your breathing? (e.g., wheezing, shortness of breath, unable to speak, severe coughing)      Shortness of breath 2. ONSET: When did this breathing problem begin?      Advair inhaler not working-patient states he has continued to have shortness of breath.  3. PATTERN Does the difficult breathing come and go, or has it been constant since it started?      constant 4. SEVERITY: How bad is your breathing? (e.g., mild, moderate, severe)      moderate 5. RECURRENT SYMPTOM: Have you had difficulty breathing before? If Yes, ask: When was the last time? and What happened that time?      yes 6. CARDIAC HISTORY: Do you have any history of heart disease? (e.g., heart attack, angina, bypass surgery, angioplasty)      no 7. LUNG HISTORY: Do you have any history of lung disease?  (e.g., pulmonary embolus, asthma, emphysema)     Asthma, emphysema 8. CAUSE: What do you think is causing the breathing problem?      unsure 10. O2 SATURATION MONITOR:  Do you use an oxygen saturation monitor (pulse oximeter) at home? If Yes, ask: What is your reading (oxygen level) today? What is your usual oxygen  saturation reading? (e.g., 95%)       NA 12. TRAVEL: Have you traveled out of the country in the last month? (e.g., travel history, exposures)       No  Patient reports that Advair inhaler that was given by pulmonary isn't working for him. Per chart review, patient see pulmonary at [facility]. Patient is needing to call [facility] about his pulmonary concerns. Patient was given phone number to the office to follow up.  Protocols used: Breathing Difficulty-A-AH

## 2023-11-19 DIAGNOSIS — J441 Chronic obstructive pulmonary disease with (acute) exacerbation: Secondary | ICD-10-CM | POA: Diagnosis not present

## 2023-11-28 DIAGNOSIS — F17210 Nicotine dependence, cigarettes, uncomplicated: Secondary | ICD-10-CM | POA: Diagnosis not present

## 2023-11-28 DIAGNOSIS — J441 Chronic obstructive pulmonary disease with (acute) exacerbation: Secondary | ICD-10-CM | POA: Diagnosis not present

## 2023-11-28 DIAGNOSIS — Z716 Tobacco abuse counseling: Secondary | ICD-10-CM | POA: Diagnosis not present

## 2023-11-28 NOTE — Progress Notes (Signed)
 DIVISION OF PULMONARY AND CRITICAL CARE MEDICINE                              FOLLOW UP ENCOUNTER     Chief complaint: Acute exacerbation of Asthma and COPD (ACOS)  History of Present Illness Michael Chaney is a 68 year old male with COPD who presents with shortness of breath.  He experiences shortness of breath without current mucus production. He coughed for the first time a couple of days ago but did not produce any sputum. He has been using cough medicine from a previous episode, which he took a couple of times recently.  He is not currently working and is not exposed to smoke and dust at work. However, he has resumed smoking after a month of abstinence, stating that cigarettes 'kicked in on me again.' He previously tried using Chantix to quit smoking, but it was not effective for him.  He is concerned about his ability to work due to his breathing difficulties, mentioning that he needs to 'get back right' to be able to work if he gets a job offer. 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  No coughing up mucus. He confirms experiencing shortness of breath.   Past Medical History:   Past Medical History:  Diagnosis Date  . Asthma (HHS-HCC)   . Hypertension     Past Surgical History:   Past Surgical History:  Procedure Laterality Date  . INGUINAL HERNIA REPAIR Bilateral 09/28/2020   Dr Lucas Catchings -- ROBOTIC    Allergies:   Allergies  Allergen Reactions  . Venom-Honey Bee Swelling    Current Medications:   Prior to Admission medications  Medication Sig Taking? Last Dose  amLODIPine  (NORVASC ) 10 MG tablet Take 1 tablet by mouth once daily Yes Taking  fluticasone propion-salmeteroL (ADVAIR DISKUS) 250-50 mcg/dose diskus inhaler Inhale 1 Puff into the lungs every 12 (twelve) hours Yes Taking  inhalational spacer (AEROCHAMBER) spacer Please instruct patient on use Yes Taking   ipratropium-albuteroL  (COMBIVENT RESPIMAT) 20-100 mcg/actuation inhaler Inhale 2 inhalations into the lungs 4 (four) times daily as needed for Wheezing Yes Taking  ipratropium-albuteroL  (DUO-NEB) nebulizer solution Take 3 mLs by nebulization 4 (four) times daily for 360 days Yes Taking  metoprolol  SUCCinate (TOPROL -XL) 25 MG XL tablet Take 1 tablet (25 mg total) by mouth once daily Yes Taking  sildenafiL  (VIAGRA ) 100 MG tablet Take 1 tablet (100 mg total) by mouth once daily as needed for Erectile Dysfunction Yes Taking  testosterone  (ANDROGEL ) 20.25 mg/1.25 gram (1.62 %) gel in metered dose pump  Yes Taking    Family History:   Family History  Problem Relation Name Age of Onset  . Asthma Mother    . COPD Father    . Emphysema Father    . Incontinence Sister    . COPD Sister    . Heart disease Brother      Social History:   Social History   Socioeconomic History  . Marital status: Married  Tobacco Use  . Smoking status: Former    Current packs/day: 0.00    Average packs/day: 0.5 packs/day for 49.5 years (27.0 ttl pk-yrs)    Types: Cigarettes    Start  date: 58    Quit date: 09/17/2023    Years since quitting: 0.1    Passive exposure: Current  . Smokeless tobacco: Never  Vaping Use  . Vaping status: Never Used  Substance and Sexual Activity  . Alcohol use: Never  . Drug use: Yes    Comment: marijuana  . Sexual activity: Yes   Social Drivers of Health   Financial Resource Strain: Patient Declined (09/06/2023)   Overall Financial Resource Strain (CARDIA)   . Difficulty of Paying Living Expenses: Patient declined  Food Insecurity: Patient Declined (09/06/2023)   Hunger Vital Sign   . Worried About Programme researcher, broadcasting/film/video in the Last Year: Patient declined   . Ran Out of Food in the Last Year: Patient declined  Transportation Needs: Patient Declined (09/06/2023)   PRAPARE - Transportation   . Lack of Transportation (Medical): Patient declined   . Lack of Transportation  (Non-Medical): Patient declined  Physical Activity: Inactive (10/10/2023)   Received from [facility]   Exercise Vital Sign   . On average, how many days per week do you engage in moderate to strenuous exercise (like a brisk walk)?: 0 days   . On average, how many minutes do you engage in exercise at this level?: 0 min  Stress: No Stress Concern Present (10/10/2023)   Received from [facility] - Occupational Stress Questionnaire   . Do you feel stress - tense, restless, nervous, or anxious, or unable to sleep at night because your mind is troubled all the time - these days?: Not at all  Social Connections: Socially Isolated (10/10/2023)   Received from [facility]   Social Connection and Isolation Panel   . In a typical week, how many times do you talk on the phone with family, friends, or neighbors?: Never   . How often do you get together with friends or relatives?: Never   . How often do you attend church or religious services?: Never   . Do you belong to any clubs or organizations such as church groups, unions, fraternal or athletic groups, or school groups?: No   . How often do you attend meetings of the clubs or organizations you belong to?: Never   . Are you married, widowed, divorced, separated, never married, or living with a partner?: Married  Housing Stability: Patient Declined (09/06/2023)   Housing Stability Vital Sign   . Unable to Pay for Housing in the Last Year: Patient declined   . Homeless in the Last Year: Patient declined    Review of Systems:   A 10 point review of systems is negative, except for the pertinent positives and negatives detailed in the HPI.  Vitals:   Vitals:   11/28/23 1459  BP: 137/84  BP Location: Left upper arm  Patient Position: Sitting  BP Cuff Size: Adult  Pulse: 87  SpO2: 95%  Weight: 80.7 kg (178 lb)  Height: 180.3 cm (5' 11)     Body mass index is 24.83 kg/m.  Physical Exam:   Physical  Exam Vitals and nursing note reviewed.  Constitutional:      General: in no acute distress.    Appearance: Normal appearance. Is not ill-appearing, toxic-appearing or diaphoretic.  HENT:     Head: Normocephalic and atraumatic.     Right Ear: External ear normal.     Left Ear: External ear normal.  Eyes:     General:        Right  eye: No discharge.        Left eye: No discharge.     Extraocular Movements: Extraocular movements intact.     Pupils: Pupils are equal, round, and reactive to light.  Cardiovascular:     Rate and Rhythm: Normal rate and regular rhythm.     Pulses: Normal pulses.     Heart sounds: Normal heart sounds. No murmur heard.    No friction rub. No gallop.  Abdominal:     General: Bowel sounds are normal.  Skin:    General: Skin is warm and dry.     Capillary Refill: Capillary refill takes less than 2 seconds.  Neurological:     Mental Status: Patient is alert.     Lab and Imaging Results:   Results     Assessment and Plan:   Diagnoses and all orders for this visit:  Acute exacerbation of chronic obstructive pulmonary disease (COPD) (CMS/HHS-HCC) -     methylPREDNISolone  (PF) (SOLU-Medrol ) 40 mg/mL injection 20 mg  Nicotine  dependence, uncomplicated, unspecified nicotine  product type  Encounter for smoking cessation counseling    Assessment & Plan Chronic obstructive pulmonary disease with acute exacerbation Acute exacerbation of COPD characterized by shortness of breath, likely triggered by recent smoking relapse. No significant cough or mucus production reported. Requires intervention to improve respiratory function and enable return to work. - Administer steroid injection to manage COPD exacerbation.  Nicotine  dependence Ongoing nicotine  dependence with recent relapse after a month of cessation. Previous use of Chantix was ineffective in maintaining smoking cessation.     I spent a total of 41 minutes in both face-to-face and  non-face-to-face activities, excluding procedures performed, for this visit on the date of this encounter.   This note has been created using dictation software tool and any typographical errors are purely unintentional.  Patient received an After Visit Summary

## 2023-12-04 ENCOUNTER — Other Ambulatory Visit
Admission: RE | Admit: 2023-12-04 | Discharge: 2023-12-04 | Disposition: A | Discharge status: Home / Self Care | Source: Ambulatory Visit | Attending: Emergency Medicine | Admitting: Emergency Medicine

## 2023-12-04 DIAGNOSIS — R0602 Shortness of breath: Secondary | ICD-10-CM | POA: Insufficient documentation

## 2023-12-04 DIAGNOSIS — J441 Chronic obstructive pulmonary disease with (acute) exacerbation: Secondary | ICD-10-CM | POA: Diagnosis not present

## 2023-12-04 DIAGNOSIS — I480 Paroxysmal atrial fibrillation: Secondary | ICD-10-CM | POA: Insufficient documentation

## 2023-12-04 LAB — D-DIMER, QUANTITATIVE: D-Dimer, Quant: <0.27 ug{FEU}/mL (ref 0.00–0.50)

## 2023-12-04 NOTE — Progress Notes (Signed)
 DIVISION OF PULMONARY AND CRITICAL CARE MEDICINE                                                         ACUTE PATIENT ENCOUNTER                                       Chief Complaint: shortness of breath   History of Present Illness Michael Chaney is a 68 year old male with COPD who presents with worsening shortness of breath.  He has experienced worsening shortness of breath over the past week, occurring during activities such as walking and even while sitting or lying down. He usually walks around the parking lot in the morning but was unable to complete his usual laps today due to shortness of breath. He also experiences shortness of breath upon waking up. No chest pain, coughing, or increased mucus production. He denies any recent viral infections.  He has a history of COPD and uses albuterol  inhalers and nebulizers regularly. Albuterol  provides some relief, but he feels his Advair inhaler is not effective. He has been using his wife's albuterol  inhaler as well. He recently resumed smoking, which coincided with the worsening of his symptoms. He recalls a previous episode of shortness of breath two months ago, for which he was evaluated at the ER and had troponin tests done which were normal.  His medication regimen includes amlodipine , Advair, and Combivent, but he does not have regular albuterol  on his list. He has previously been on azithromycin  and prednisone , which provided some relief. He is not currently on any blood thinners despite a history of atrial fibrillation.     Past medical History: He  has a past medical history of Asthma (HHS-HCC) and Hypertension.   Allergies:  is allergic to venom-honey bee.  ROS: 10 point review of systems has been conducted during interview and evaluation today. Remainder has been reviewed and is negative except as per HPI  Current Medications (including changes made at this visit) Current  Outpatient Medications  Medication Sig Dispense Refill  . amLODIPine  (NORVASC ) 10 MG tablet Take 1 tablet by mouth once daily    . fluticasone propion-salmeteroL (ADVAIR DISKUS) 250-50 mcg/dose diskus inhaler Inhale 1 Puff into the lungs every 12 (twelve) hours 180 each 1  . inhalational spacer (AEROCHAMBER) spacer Please instruct patient on use 1 each 2  . ipratropium-albuteroL  (COMBIVENT RESPIMAT) 20-100 mcg/actuation inhaler Inhale 2 inhalations into the lungs 4 (four) times daily as needed for Wheezing 12 g 3  . ipratropium-albuteroL  (DUO-NEB) nebulizer solution Take 3 mLs by nebulization 4 (four) times daily for 360 days 180 mL 6  . sildenafiL  (VIAGRA ) 100 MG tablet Take 1 tablet (100 mg total) by mouth once daily as needed for Erectile Dysfunction 15 tablet 5  . testosterone  (ANDROGEL ) 20.25 mg/1.25 gram (1.62 %) gel in metered dose pump     . metoprolol  SUCCinate (TOPROL -XL) 25 MG XL tablet Take 1 tablet (25 mg total) by mouth once daily 30 tablet 11  No current facility-administered medications for this visit.      Social History:  He  reports that he has been smoking cigarettes. He started smoking about 59 years ago. He has a 27 pack-year smoking history. He has been exposed to tobacco smoke. He has never used smokeless tobacco. He reports current drug use. He reports that he does not drink alcohol.  Surgical History:  has a past surgical history that includes Inguinal hernia repair (Bilateral, 09/28/2020).  Family History: His family history includes Asthma in his mother; COPD in his father and sister; Emphysema in his father; Heart disease in his brother; Incontinence in his sister.  Physical Exam: BP (!) 141/83   Pulse 81   Ht 180.3 cm (5' 11)   Wt 81.6 kg (180 lb)   SpO2 98%   BMI 25.10 kg/m   Physical Exam GENERAL: Alert, cooperative, well developed, no acute distress. HEENT: Normocephalic, normal oropharynx, moist mucous membranes, cerumen present in ears, minimal  nasal mucus, postnasal drip present. CHEST: Wheezing present throughout lung fields. CARDIOVASCULAR: irregular rhythm, S1 and S2 normal without murmurs. ABDOMEN: Soft, non-tender, non-distended, without organomegaly, normal bowel sounds. EXTREMITIES: No cyanosis or edema. NEUROLOGICAL: Cranial nerves grossly intact, moves all extremities without gross motor or sensory deficit.  Labs & Imaging:      MedicalDecision Making:   Patient has 1 or more acute or chronic illness or injury that poses a threat to life or bodily function. Review of prior external notes including review of results of blood work and imaging was performed today. Additional tests have been ordered, I have independently reviewed the chart and including microbiology, chemistry, imaging and procedures.      Impression/Plan:    1. Shortness of breath -     methylPREDNISolone  (PF) (SOLU-Medrol ) 40 mg/mL injection 20 mg -     levalbuterol  (XOPENEX ) 1.25 mg/3 mL nebulizer solution 1.25 mg -     D-dimer ([facility]) -     C-Reactive Protein, Quant - [facility] -     CBC w/auto Differential (5 Part)  2. Paroxysmal atrial fibrillation (CMS/HHS-HCC) -     Ambulatory Referral to Cardiology -     D-dimer [facility]) -     C-Reactive Protein, Quant - [facility] -     CBC w/auto Differential (5 Part)  3. COPD with acute exacerbation (CMS/HHS-HCC) -     levalbuterol  (XOPENEX ) 1.25 mg/3 mL nebulizer solution 1.25 mg -     D-dimer ([facility]) -     C-Reactive Protein, Quant - [facility] -     CBC w/auto Differential (5 Part)  Other orders -     azithromycin  (ZITHROMAX ) 250 MG tablet; Take 2 tablets (500mg ) by mouth on Day 1. Take 1 tablet (250mg ) by mouth on Days 2-5. -     albuterol  MDI, PROVENTIL , VENTOLIN , PROAIR , HFA 90 mcg/actuation inhaler; Inhale 2 inhalations into the lungs every 4 (four) hours as needed for Wheezing -     dexAMETHasone  (DECADRON ) 4 MG tablet; Take 2 tablets (8 mg total) by mouth daily with breakfast for 4 days, THEN 1  tablet (4 mg total) daily with breakfast for 4 days, THEN 0.5 tablets (2 mg total) daily with breakfast for 4 days. -     promethazine -dextromethorphan  (PROMETHAZINE -DM) 6.25-15 mg/5 mL syrup; Take 5 mLs by mouth every 6 (six) hours as needed for Cough -     budesonide-formoteroL (SYMBICORT) 160-4.5 mcg/actuation inhaler; Inhale 2 inhalations into the lungs 2 (two) times daily -     albuterol  (PROVENTIL )  2.5 mg /3 mL (0.083 %) nebulizer solution; Take 3 mLs (2.5 mg total) by nebulization every 4 (four) hours as needed for Wheezing or Shortness of Breath -     Discontinue: revefenacin (YUPELRI) 175 mcg/3 mL nebulizer solution; Take 3 mLs (175 mcg total) by nebulization once daily Use with albuterol  nebulizer medication     Assessment & Plan Chronic obstructive pulmonary disease with acute exacerbation Acute exacerbation of COPD with worsening dyspnea over the past week. Advair is ineffective, and albuterol  provides temporary relief. No significant cough or sputum production. Wheezing present. Differential includes possible pulmonary embolism due to worsening symptoms when supine. Previous prednisone  and azithromycin  provided relief. Insurance favors nebulizer over inhalers. Decadron  prescribed for its potency over prednisone . - Prescribe Decadron  tablets with a tapered dose: two tablets for four days, then one tablet for six days. - Prescribe azithromycin  for pulmonary inflammation. - Administer Solu-Medrol  injection today. - Order blood work to rule out pulmonary embolism. - If blood work is positive, order a stat chest CT to evaluate for pulmonary embolism. - Prescribe Xopenex  for nebulizer use. - Continue Advair until follow-up. - ORDER Yupleri - Schedule follow-up in two weeks to reassess symptoms and medication efficacy.  Paroxysmal atrial fibrillation Paroxysmal atrial fibrillation with no current anticoagulation therapy, increasing risk of thromboembolism and stroke. No recent cardiology  follow-up. Aware of risks associated with untreated AFib. - Refer to cardiology for management of atrial fibrillation and consideration of anticoagulation therapy.  Tobacco use disorder Recent resumption of smoking, potentially exacerbating respiratory symptoms. Currently using Chantix for smoking cessation. Discussed smoking's impact on respiratory health. - Discuss the impact of smoking on respiratory health and encourage cessation. - Pharmacological options reviewed.    Recording duration: 41 minutes     PLAN: START Yupleri with albuterol   Azithromycin  and Decadron  for exacerbation 2 week f/u with review of SOB     Patient relates understanding our goals for this visit and is agreeable to above plans.    Thank you for allowing me to participate in the care of this patient.   This document was prepared using Dragon voice recognition software and may include unintentional dictation errors. This note has been created using automated tools and reviewed for accuracy by [facility].    Attestation Statement:   I personally performed the service, non-incident to. (WP)   DEBBIE MARIE BLITCH, NP

## 2023-12-04 NOTE — Progress Notes (Signed)
 DIVISION OF PULMONARY AND CRITICAL CARE MEDICINE                                                          Acute PATIENT ENCOUNTER                                         Chief Complaint: COPD exacerbation/shortness of breath   HPI :  [name] is 68 y.o. male who was referred to [facility] clinic due to   History of Present Illness   Past medical History: He  has a past medical history of Asthma (HHS-HCC) and Hypertension.   Allergies:  is allergic to venom-honey bee.  ROS: 10 point review of systems has been conducted during interview and evaluation today. Remainder has been reviewed and is negative except as per HPI  Current Medications (including changes made at this visit) Current Outpatient Medications  Medication Sig Dispense Refill  . amLODIPine  (NORVASC ) 10 MG tablet Take 1 tablet by mouth once daily    . fluticasone propion-salmeteroL (ADVAIR DISKUS) 250-50 mcg/dose diskus inhaler Inhale 1 Puff into the lungs every 12 (twelve) hours 180 each 1  . inhalational spacer (AEROCHAMBER) spacer Please instruct patient on use 1 each 2  . ipratropium-albuteroL  (COMBIVENT RESPIMAT) 20-100 mcg/actuation inhaler Inhale 2 inhalations into the lungs 4 (four) times daily as needed for Wheezing 12 g 3  . ipratropium-albuteroL  (DUO-NEB) nebulizer solution Take 3 mLs by nebulization 4 (four) times daily for 360 days 180 mL 6  . metoprolol  SUCCinate (TOPROL -XL) 25 MG XL tablet Take 1 tablet (25 mg total) by mouth once daily 30 tablet 11  . sildenafiL  (VIAGRA ) 100 MG tablet Take 1 tablet (100 mg total) by mouth once daily as needed for Erectile Dysfunction 15 tablet 5  . testosterone  (ANDROGEL ) 20.25 mg/1.25 gram (1.62 %) gel in metered dose pump      No current facility-administered medications for this visit.      Social History:  He  reports that he quit smoking about 2 months ago. His smoking use included  cigarettes. He started smoking about 59 years ago. He has a 27 pack-year smoking history. He has been exposed to tobacco smoke. He has never used smokeless tobacco. He reports current drug use. He reports that he does not drink alcohol.  Surgical History:  has a past surgical history that includes Inguinal hernia repair (Bilateral, 09/28/2020).  Family History: His family history includes Asthma in his mother; COPD in his father and sister; Emphysema in his father; Heart disease in his brother; Incontinence in his sister.  Physical Exam: Ht 180.3 cm (5' 11)   BMI 24.83 kg/m  GENERAL: NAD, able to speak in complete sentences without dyspnea or cough HEENT: normocephalic, PERRL, EOMI, TM with sharp light reflex bilaterally.  Normal external auditory canal. No hypertrophy of nasal turbinates.  Clear mucosa in mouth without any exudates or erythema NECK: Supple, no jugular venous distension, nodes, stridor nor thyromegaly. Trachea midline.  CARDIOVASCULAR: RRR, no murmurs, gallops, rubs RESPIRATORY: normal respiratory effort, clear to auscultation bilaterally. No crackles, or wheezes GASTROINTESTINAL: NABS, soft, non tender, non distended EXTR: No edema, homans sign or cyanosis LYMPHATIC: No nodes in neck or supraclavicular areas INTEGUMENTARY: No rashes, bruising, telantagias, sclerodactaly nor  alopecia NEURO:  Cranial nerves, motor, sensation intact. Normal gait  Physical Exam   Labs & Imaging:      MedicalDecision Making:   Patient has 1 or more acute or chronic illness or injury that poses a threat to life or bodily function. Review of prior external notes including review of results of blood work and imaging was performed today. Additional tests have been ordered, I have independently reviewed the chart and including microbiology, chemistry, imaging and procedures.      Impression/Plan:    There are no diagnoses linked to this encounter.      Assessment &  Plan      PLAN:    Patient relates understanding our goals for this visit and is agreeable to above plans.    Thank you for allowing me to participate in the care of this patient.   This document was prepared using Dragon voice recognition software and may include unintentional dictation errors. This note has been created using automated tools and reviewed for accuracy by [facility].    Attestation Statement:   I personally performed the service, non-incident to. (WP)   DEBBIE MARIE BLITCH, NP   @SIGNATUREIMG @

## 2023-12-05 ENCOUNTER — Other Ambulatory Visit

## 2023-12-06 DIAGNOSIS — R002 Palpitations: Secondary | ICD-10-CM | POA: Diagnosis not present

## 2023-12-06 DIAGNOSIS — E782 Mixed hyperlipidemia: Secondary | ICD-10-CM | POA: Diagnosis not present

## 2023-12-06 DIAGNOSIS — I480 Paroxysmal atrial fibrillation: Secondary | ICD-10-CM | POA: Diagnosis not present

## 2023-12-06 DIAGNOSIS — R7303 Prediabetes: Secondary | ICD-10-CM | POA: Diagnosis not present

## 2023-12-06 DIAGNOSIS — R0602 Shortness of breath: Secondary | ICD-10-CM | POA: Diagnosis not present

## 2023-12-06 DIAGNOSIS — I10 Essential (primary) hypertension: Secondary | ICD-10-CM | POA: Diagnosis not present

## 2023-12-19 ENCOUNTER — Ambulatory Visit

## 2023-12-25 ENCOUNTER — Emergency Department
Admission: EM | Admit: 2023-12-25 | Discharge: 2023-12-25 | Disposition: A | Discharge status: Home / Self Care | Attending: Emergency Medicine | Admitting: Emergency Medicine

## 2023-12-25 ENCOUNTER — Encounter: Payer: Self-pay | Admitting: Emergency Medicine

## 2023-12-25 ENCOUNTER — Emergency Department

## 2023-12-25 DIAGNOSIS — J4521 Mild intermittent asthma with (acute) exacerbation: Secondary | ICD-10-CM | POA: Insufficient documentation

## 2023-12-25 DIAGNOSIS — Z720 Tobacco use: Secondary | ICD-10-CM | POA: Diagnosis not present

## 2023-12-25 DIAGNOSIS — R0602 Shortness of breath: Secondary | ICD-10-CM | POA: Diagnosis present

## 2023-12-25 DIAGNOSIS — I10 Essential (primary) hypertension: Secondary | ICD-10-CM | POA: Insufficient documentation

## 2023-12-25 DIAGNOSIS — Z79899 Other long term (current) drug therapy: Secondary | ICD-10-CM | POA: Insufficient documentation

## 2023-12-25 LAB — CBC
HCT: 39.1 % (ref 39.0–52.0)
Hemoglobin: 13.1 g/dL (ref 13.0–17.0)
MCH: 31.6 pg (ref 26.0–34.0)
MCHC: 33.5 g/dL (ref 30.0–36.0)
MCV: 94.4 fL (ref 80.0–100.0)
Platelets: 192 K/uL (ref 150–400)
RBC: 4.14 MIL/uL — ABNORMAL LOW (ref 4.22–5.81)
RDW: 14.6 % (ref 11.5–15.5)
WBC: 9.5 K/uL (ref 4.0–10.5)
nRBC: 0 % (ref 0.0–0.2)

## 2023-12-25 LAB — BASIC METABOLIC PANEL WITH GFR
Anion gap: 12 (ref 5–15)
BUN: 19 mg/dL (ref 8–23)
CO2: 20 mmol/L — ABNORMAL LOW (ref 22–32)
Calcium: 9.3 mg/dL (ref 8.9–10.3)
Chloride: 109 mmol/L (ref 98–111)
Creatinine, Ser: 1.05 mg/dL (ref 0.61–1.24)
GFR, Estimated: >60 mL/min (ref >60)
Glucose, Bld: 143 mg/dL — ABNORMAL HIGH (ref 70–99)
Potassium: 3.6 mmol/L (ref 3.5–5.1)
Sodium: 141 mmol/L (ref 135–145)

## 2023-12-25 LAB — RESP PANEL BY RT-PCR (RSV, FLU A&B, COVID)  RVPGX2
Influenza A by PCR: NEGATIVE
Influenza B by PCR: NEGATIVE
Resp Syncytial Virus by PCR: NEGATIVE
SARS Coronavirus 2 by RT PCR: NEGATIVE

## 2023-12-25 MED ORDER — PREDNISONE 20 MG PO TABS
40.0000 mg | ORAL_TABLET | Freq: Once | ORAL | Status: AC
  Administered 2023-12-25: 40 mg via ORAL
  Filled 2023-12-25: qty 2

## 2023-12-25 MED ORDER — FLUTICASONE-SALMETEROL 100-50 MCG/ACT IN AEPB: 1.0000 | INHALATION_SPRAY | Freq: Two times a day (BID) | RESPIRATORY_TRACT | 0 refills | Status: AC

## 2023-12-25 MED ORDER — PREDNISONE 20 MG PO TABS: 60.0000 mg | ORAL_TABLET | Freq: Every day | ORAL | 0 refills | Status: DC

## 2023-12-25 MED ORDER — ALBUTEROL SULFATE HFA 108 (90 BASE) MCG/ACT IN AERS: 2.0000 | INHALATION_SPRAY | RESPIRATORY_TRACT | 0 refills | Status: AC | PRN

## 2023-12-25 MED ORDER — METHYLPREDNISOLONE SODIUM SUCC 125 MG IJ SOLR
125.0000 mg | Freq: Once | INTRAMUSCULAR | Status: AC
  Administered 2023-12-25: 125 mg via INTRAVENOUS
  Filled 2023-12-25: qty 2

## 2023-12-25 MED ORDER — IPRATROPIUM-ALBUTEROL 0.5-2.5 (3) MG/3ML IN SOLN
3.0000 mL | RESPIRATORY_TRACT | Status: AC
  Administered 2023-12-25 (×3): 3 mL via RESPIRATORY_TRACT
  Filled 2023-12-25: qty 3
  Filled 2023-12-25: qty 6

## 2023-12-25 NOTE — ED Provider Notes (Signed)
 [facility] Provider Note    Event Date/Time   First MD Initiated Contact with Patient 12/25/23 0141     (approximate)   History   Shortness of Breath   HPI  Michael Chaney is a 68 y.o. male with history of asthma, hypertension, hyperlipidemia, prior tobacco use who presents to the emergency department shortness of breath, wheezing that started tonight.  Tried his nebulizer machine at home without relief.  No chest pain, fevers, productive cough, calf swelling or calf tenderness.  No prior history of PE, DVT, CHF   History provided by patient.    Past Medical History:  Diagnosis Date   Asthma    Dyspnea    Hyperlipidemia    Hypertension     Past Surgical History:  Procedure Laterality Date   XI ROBOTIC ASSISTED INGUINAL HERNIA REPAIR WITH MESH Bilateral 09/28/2020   Procedure: XI ROBOTIC ASSISTED INGUINAL HERNIA REPAIR WITH MESH;  Surgeon: Rodolph Romano, MD;  Location: [facility] ORS;  Service: General;  Laterality: Bilateral;    MEDICATIONS:  Prior to Admission medications   Medication Sig Start Date End Date Taking? Authorizing Provider  amLODipine  (NORVASC ) 10 MG tablet Take 1 tablet by mouth once daily 09/02/23   Pender, Julie F, FNP  fluticasone-salmeterol (ADVAIR) 100-50 MCG/ACT AEPB Inhale 1 puff into the lungs 2 (two) times daily.    [provider]  rosuvastatin  (CRESTOR ) 10 MG tablet Take 1 tablet (10 mg total) by mouth daily. 10/21/23   Gareth Mliss FALCON, FNP  sildenafil  (VIAGRA ) 100 MG tablet TAKE 1 TABLET BY MOUTH 1 HOUR PRIOR TO INTERCOURSE 10/30/23   Stoioff, Glendia BROCKS, MD  Testosterone  1.62 % GEL APPLY 1 PUMP TO EACH SHOULDER DAILY 10/30/23   Maurine Lukes, PA-C  VENTOLIN  HFA 108 (90 Base) MCG/ACT inhaler INHALE 1 TO 2 PUFFS BY MOUTH EVERY 4 HOURS AS NEEDED FOR WHEEZING OR SHORTNESS OF BREATH 11/08/23   Gareth Mliss FALCON, FNP    Physical Exam   Triage Vital Signs: ED Triage Vitals [12/25/23 0112]  Encounter Vitals  Group     BP 134/72     Girls Systolic BP Percentile      Girls Diastolic BP Percentile      Boys Systolic BP Percentile      Boys Diastolic BP Percentile      Pulse Rate (!) 105     Resp (!) 22     Temp 98.9 F (37.2 C)     Temp Source Oral     SpO2 97 %     Weight      Height      Head Circumference      Peak Flow      Pain Score 0     Pain Loc      Pain Education      Exclude from Growth Chart     Most recent vital signs: Vitals:   12/25/23 0112 12/25/23 0200  BP: 134/72 (!) 133/98  Pulse: (!) 105 82  Resp: (!) 22 20  Temp: 98.9 F (37.2 C)   SpO2: 97% 94%    CONSTITUTIONAL: Alert, responds appropriately to questions.  HEAD: Normocephalic, atraumatic EYES: Conjunctivae clear, pupils appear equal, sclera nonicteric ENT: normal nose; moist mucous membranes NECK: Supple, normal ROM CARD: RRR; S1 and S2 appreciated RESP: Normal chest excursion without splinting or tachypnea; breath sounds equal bilaterally but he has diminished aeration with diffuse expiratory wheezes, no rhonchi or rales, speaking short sentences, sats 93% on room air  at rest ABD/GI: Non-distended; soft, non-tender, no rebound, no guarding, no peritoneal signs BACK: The back appears normal EXT: Normal ROM in all joints; no deformity noted, no edema, no calf tenderness or calf swelling SKIN: Normal color for age and race; warm; no rash on exposed skin NEURO: Moves all extremities equally, normal speech PSYCH: The patient's mood and manner are appropriate.   ED Results / Procedures / Treatments   LABS: (all labs ordered are listed, but only abnormal results are displayed) Labs Reviewed  BASIC METABOLIC PANEL WITH GFR - Abnormal; Notable for the following components:      Result Value   CO2 20 (*)    Glucose, Bld 143 (*)    All other components within normal limits  CBC - Abnormal; Notable for the following components:   RBC 4.14 (*)    All other components within normal limits  RESP PANEL BY  RT-PCR (RSV, FLU A&B, COVID)  [id number]     EKG:  EKG Interpretation Date/Time:  Wednesday December 25 2023 02:05:51 EDT Ventricular Rate:  83 PR Interval:  160 QRS Duration:  83 QT Interval:  362 QTC Calculation: 426 R Axis:   65  Text Interpretation: Sinus rhythm Supraventricular bigeminy Confirmed by Neomi Neptune [phone]) on 12/25/2023 2:08:50 AM         RADIOLOGY: My personal review and interpretation of imaging: Chest x-ray clear.  I have personally reviewed all radiology reports.   DG Chest 2 View Result Date: 12/25/2023 CLINICAL DATA:  Labored breathing. EXAM: CHEST - 2 VIEW COMPARISON:  October 14, 2023 FINDINGS: The heart size and mediastinal contours are within normal limits. Both lungs are clear. The visualized skeletal structures are unremarkable. IMPRESSION: No active cardiopulmonary disease. Electronically Signed   By: Suzen Dials M.D.   On: 12/25/2023 01:40     PROCEDURES:  Critical Care performed: Yes, see critical care procedure note(s)   CRITICAL CARE Performed by: Neptune Minahil Quinlivan   Total critical care time: 30 minutes  Critical care time was exclusive of separately billable procedures and treating other patients.  Critical care was necessary to treat or prevent imminent or life-threatening deterioration.  Critical care was time spent personally by me on the following activities: development of treatment plan with patient and/or surrogate as well as nursing, discussions with consultants, evaluation of patient's response to treatment, examination of patient, obtaining history from patient or surrogate, ordering and performing treatments and interventions, ordering and review of laboratory studies, ordering and review of radiographic studies, pulse oximetry and re-evaluation of patient's condition.   SABRA1-3 Lead EKG Interpretation  Performed by: Janylah Belgrave, Neptune SAILOR, DO Authorized by: Demetrick Eichenberger, Neptune SAILOR, DO     Interpretation: normal     ECG rate:  82   ECG rate  assessment: normal     Rhythm: sinus rhythm     Ectopy: none     Conduction: normal       IMPRESSION / MDM / ASSESSMENT AND PLAN / ED COURSE  I reviewed the triage vital signs and the nursing notes.    Patient here with shortness of breath, wheezing.  History of asthma.  The patient is on the cardiac monitor to evaluate for evidence of arrhythmia and/or significant heart rate changes.   DIFFERENTIAL DIAGNOSIS (includes but not limited to):   Asthma exacerbation, viral URI, pneumonia, less likely pneumothorax, CHF, PE, ACS   Patient's presentation is most consistent with acute presentation with potential threat to life or bodily function.   PLAN: Will give  breathing treatments, IV steroids.  EKG nonischemic.  Will obtain chest x-ray, COVID and flu swab.  Normal labs with no leukocytosis, normal electrolytes.   MEDICATIONS GIVEN IN ED: Medications  ipratropium-albuterol  (DUONEB) 0.5-2.5 (3) MG/3ML nebulizer solution 3 mL (3 mLs Nebulization Given 12/25/23 0254)  predniSONE  (DELTASONE ) tablet 40 mg (has no administration in time range)  methylPREDNISolone  sodium succinate (SOLU-MEDROL ) 125 mg/2 mL injection 125 mg (125 mg Intravenous Given 12/25/23 0219)     ED COURSE: Chest x-ray reviewed and interpreted by myself and the radiologist and is unremarkable.  3:19 AM  Pt's lungs are clear to auscultation.  Able to speak full sentences.  No hypoxia or respiratory distress.  He states however he is not feeling any better.  He states that oral prednisone  has helped him the most.  He is requesting a dose here.  He has already received 125 mg of IV Solu-Medrol  but is insistent that he receive oral medications as well.  Will give him a dose of prednisone  here.  Have offered him admission to the hospital given he is still symptomatic but he declined stating that he has follow-up with his pulmonologist in the morning at 9 AM.  Will refill his inhalers.  Will discharge with a prednisone   burst.  CONSULTS: Admission offered to patient given he complains he is still feeling short of breath however he declines and will follow-up with his pulmonologist in the morning.  Return precautions discussed.   OUTSIDE RECORDS REVIEWED: Reviewed last cardiology note on 12/06/2023.       FINAL CLINICAL IMPRESSION(S) / ED DIAGNOSES   Final diagnoses:  Exacerbation of intermittent asthma, unspecified asthma severity     Rx / DC Orders   ED Discharge Orders          Ordered    albuterol  (VENTOLIN  HFA) 108 (90 Base) MCG/ACT inhaler  Every 4 hours PRN        12/25/23 0319    predniSONE  (DELTASONE ) 20 MG tablet  Daily        12/25/23 0319    fluticasone-salmeterol (ADVAIR) 100-50 MCG/ACT AEPB  2 times daily        12/25/23 0319             Note:  This document was prepared using Dragon voice recognition software and may include unintentional dictation errors.   Lauralie Blacksher, Josette SAILOR, DO 12/25/23 [phone]

## 2023-12-25 NOTE — Discharge Instructions (Addendum)
 Please follow-up with your pulmonologist in the morning at 9 AM as scheduled.  Please take your prednisone , inhalers as prescribed.

## 2023-12-25 NOTE — ED Triage Notes (Signed)
 Pt arrives POV, ambulatory to triage w/ labored breathing c/o asthma attack.. pt reports it started around midnight and no relief w/ rescue inhaler. No audible wheezing or stridor noted.

## 2023-12-26 ENCOUNTER — Other Ambulatory Visit: Payer: Self-pay | Admitting: Urology

## 2023-12-26 ENCOUNTER — Other Ambulatory Visit: Payer: Self-pay | Admitting: Nurse Practitioner

## 2023-12-26 ENCOUNTER — Other Ambulatory Visit: Payer: Self-pay | Admitting: Physician Assistant

## 2023-12-26 DIAGNOSIS — E291 Testicular hypofunction: Secondary | ICD-10-CM

## 2023-12-26 DIAGNOSIS — I10 Essential (primary) hypertension: Secondary | ICD-10-CM

## 2023-12-27 ENCOUNTER — Other Ambulatory Visit: Payer: Self-pay | Admitting: Urology

## 2023-12-27 ENCOUNTER — Other Ambulatory Visit: Payer: Self-pay

## 2023-12-27 ENCOUNTER — Telehealth: Payer: Self-pay

## 2023-12-27 DIAGNOSIS — E291 Testicular hypofunction: Secondary | ICD-10-CM

## 2023-12-27 NOTE — Telephone Encounter (Signed)
 Requested Prescriptions  Pending Prescriptions Disp Refills   amLODipine  (NORVASC ) 10 MG tablet [Pharmacy Med Name: amLODIPine  Besylate 10 MG Oral Tablet] 90 tablet 1    Sig: Take 1 tablet by mouth once daily     Cardiovascular: Calcium  Channel Blockers 2 Passed - 12/27/2023  1:28 PM      Passed - Last BP in normal range    BP Readings from Last 1 Encounters:  12/25/23 128/71         Passed - Last Heart Rate in normal range    Pulse Readings from Last 1 Encounters:  12/25/23 77         Passed - Valid encounter within last 6 months    Recent Outpatient Visits           2 months ago Annual physical exam   [facility] Gareth Mliss FALCON, FNP   7 months ago Chest pain, unspecified type   [facility] Gareth Mliss FALCON, FNP       Future Appointments             In 3 months Gareth, Mliss FALCON, FNP [facility] [place]

## 2023-12-27 NOTE — Telephone Encounter (Signed)
 Spoke with patient and informed him his Testosterone  Prescription has been approved and refilled however he is late on his lab work. Scheduled patient for 10/07 at 11 AM. Patient accepted appointment and voiced understanding.

## 2023-12-31 ENCOUNTER — Other Ambulatory Visit

## 2023-12-31 DIAGNOSIS — E291 Testicular hypofunction: Secondary | ICD-10-CM

## 2024-01-01 ENCOUNTER — Ambulatory Visit: Payer: Self-pay | Admitting: Physician Assistant

## 2024-01-01 LAB — TESTOSTERONE: Testosterone: 185 ng/dL — ABNORMAL LOW (ref 264–916)

## 2024-01-27 ENCOUNTER — Other Ambulatory Visit: Payer: Self-pay | Admitting: Physician Assistant

## 2024-01-27 DIAGNOSIS — E291 Testicular hypofunction: Secondary | ICD-10-CM

## 2024-01-28 DIAGNOSIS — Z87891 Personal history of nicotine dependence: Secondary | ICD-10-CM | POA: Diagnosis not present

## 2024-01-28 DIAGNOSIS — J4489 Other specified chronic obstructive pulmonary disease: Secondary | ICD-10-CM | POA: Diagnosis not present

## 2024-01-28 DIAGNOSIS — Z122 Encounter for screening for malignant neoplasm of respiratory organs: Secondary | ICD-10-CM | POA: Diagnosis not present

## 2024-01-29 ENCOUNTER — Telehealth: Payer: Self-pay | Admitting: Physician Assistant

## 2024-01-29 ENCOUNTER — Encounter: Payer: Self-pay | Admitting: Emergency Medicine

## 2024-01-29 ENCOUNTER — Other Ambulatory Visit

## 2024-01-29 ENCOUNTER — Other Ambulatory Visit: Payer: Self-pay | Admitting: Emergency Medicine

## 2024-01-29 DIAGNOSIS — Z539 Procedure and treatment not carried out, unspecified reason: Secondary | ICD-10-CM

## 2024-01-29 DIAGNOSIS — Z87891 Personal history of nicotine dependence: Secondary | ICD-10-CM

## 2024-01-29 DIAGNOSIS — Z122 Encounter for screening for malignant neoplasm of respiratory organs: Secondary | ICD-10-CM

## 2024-01-29 DIAGNOSIS — J4489 Other specified chronic obstructive pulmonary disease: Secondary | ICD-10-CM

## 2024-01-29 DIAGNOSIS — E291 Testicular hypofunction: Secondary | ICD-10-CM

## 2024-01-29 NOTE — Telephone Encounter (Signed)
 Pt came in for his LAB appt and informed us  that he does not have anymore refills on his testosterone  and needs a new prescription called in to [facility] on [address].

## 2024-01-30 LAB — TESTOSTERONE: Testosterone: 296 ng/dL (ref 264–916)

## 2024-02-01 ENCOUNTER — Ambulatory Visit: Payer: Self-pay | Admitting: Physician Assistant

## 2024-02-03 ENCOUNTER — Other Ambulatory Visit: Payer: Self-pay | Admitting: *Deleted

## 2024-02-03 ENCOUNTER — Telehealth: Payer: Self-pay | Admitting: Urology

## 2024-02-03 ENCOUNTER — Other Ambulatory Visit: Payer: Self-pay

## 2024-02-03 DIAGNOSIS — R972 Elevated prostate specific antigen [PSA]: Secondary | ICD-10-CM

## 2024-02-03 DIAGNOSIS — E291 Testicular hypofunction: Secondary | ICD-10-CM

## 2024-02-03 MED ORDER — SILDENAFIL CITRATE 100 MG PO TABS: ORAL_TABLET | ORAL | 0 refills | Status: AC

## 2024-02-03 NOTE — Telephone Encounter (Signed)
 lab visit scheduled for testosterone , H&H, PSA on new dose. Patient accepted appointment and voiced understanding.

## 2024-02-03 NOTE — Telephone Encounter (Signed)
 Patient called requesting a refill for Sildenafil  to be sent to pharmacy. Patient states he has about 3 tablets left. Please advise.

## 2024-02-03 NOTE — Telephone Encounter (Signed)
See prior notes

## 2024-02-03 NOTE — Telephone Encounter (Signed)
 Increasing testosterone  gel to 2 pumps daily from 1 pump daily due to subtherapeutic levels.

## 2024-02-04 ENCOUNTER — Ambulatory Visit
Admission: RE | Admit: 2024-02-04 | Discharge: 2024-02-04 | Disposition: A | Discharge status: Home / Self Care | Source: Ambulatory Visit | Attending: Emergency Medicine | Admitting: Emergency Medicine

## 2024-02-04 DIAGNOSIS — Z122 Encounter for screening for malignant neoplasm of respiratory organs: Secondary | ICD-10-CM | POA: Insufficient documentation

## 2024-02-04 DIAGNOSIS — J4489 Other specified chronic obstructive pulmonary disease: Secondary | ICD-10-CM | POA: Insufficient documentation

## 2024-02-04 DIAGNOSIS — F17210 Nicotine dependence, cigarettes, uncomplicated: Secondary | ICD-10-CM | POA: Diagnosis not present

## 2024-02-04 DIAGNOSIS — Z87891 Personal history of nicotine dependence: Secondary | ICD-10-CM | POA: Diagnosis not present

## 2024-03-03 ENCOUNTER — Other Ambulatory Visit: Payer: Self-pay | Admitting: Physician Assistant

## 2024-03-03 ENCOUNTER — Telehealth: Payer: Self-pay

## 2024-03-03 DIAGNOSIS — Z1211 Encounter for screening for malignant neoplasm of colon: Secondary | ICD-10-CM

## 2024-03-03 DIAGNOSIS — E291 Testicular hypofunction: Secondary | ICD-10-CM

## 2024-03-03 NOTE — Telephone Encounter (Signed)
 Copied from CRM [id number]. Topic: Clinical - Request for Lab/Test Order >> Mar 03, 2024  9:30 AM Treva T wrote: Reason for CRM: Pt calling, stets he received a letter statting that he is due for his colonoscopy.  Pt wants to know if he can have an order to have this test done, or the at home test, Cologard.  Pt would like a follow up call of next steps and to discuss further, can be reached at [phone].  Pt is aware of call back.

## 2024-03-15 LAB — COLOGUARD: COLOGUARD: NEGATIVE

## 2024-03-16 ENCOUNTER — Ambulatory Visit: Payer: Self-pay | Admitting: Nurse Practitioner

## 2024-03-24 ENCOUNTER — Other Ambulatory Visit

## 2024-03-24 DIAGNOSIS — R972 Elevated prostate specific antigen [PSA]: Secondary | ICD-10-CM | POA: Diagnosis not present

## 2024-03-24 DIAGNOSIS — E291 Testicular hypofunction: Secondary | ICD-10-CM

## 2024-03-25 ENCOUNTER — Ambulatory Visit: Payer: Self-pay | Admitting: Physician Assistant

## 2024-03-25 DIAGNOSIS — E291 Testicular hypofunction: Secondary | ICD-10-CM

## 2024-03-25 LAB — PSA: Prostate Specific Ag, Serum: 3.8 ng/mL (ref 0.0–4.0)

## 2024-03-25 LAB — HEMOGLOBIN AND HEMATOCRIT, BLOOD
Hematocrit: 50.4 % (ref 37.5–51.0)
Hemoglobin: 16.8 g/dL (ref 13.0–17.7)

## 2024-03-25 LAB — TESTOSTERONE: Testosterone: 404 ng/dL (ref 264–916)

## 2024-03-30 MED ORDER — TESTOSTERONE 1.62 % TD GEL: TRANSDERMAL | 2 refills | Status: AC

## 2024-04-13 ENCOUNTER — Ambulatory Visit: Payer: Self-pay | Admitting: Nurse Practitioner

## 2024-04-13 ENCOUNTER — Ambulatory Visit: Admitting: Nurse Practitioner

## 2024-04-13 ENCOUNTER — Encounter: Payer: Self-pay | Admitting: Nurse Practitioner

## 2024-04-13 ENCOUNTER — Ambulatory Visit
Admission: RE | Admit: 2024-04-13 | Discharge: 2024-04-13 | Disposition: A | Discharge status: Home / Self Care | Attending: Nurse Practitioner | Admitting: Nurse Practitioner

## 2024-04-13 ENCOUNTER — Ambulatory Visit
Admission: RE | Admit: 2024-04-13 | Discharge: 2024-04-13 | Disposition: A | Source: Ambulatory Visit | Attending: Nurse Practitioner

## 2024-04-13 VITALS — BP 120/76 | HR 91 | Temp 98.0°F | Resp 18 | Ht 71.0 in | Wt 180.5 lb

## 2024-04-13 DIAGNOSIS — N529 Male erectile dysfunction, unspecified: Secondary | ICD-10-CM

## 2024-04-13 DIAGNOSIS — J439 Emphysema, unspecified: Secondary | ICD-10-CM | POA: Diagnosis not present

## 2024-04-13 DIAGNOSIS — R7303 Prediabetes: Secondary | ICD-10-CM | POA: Diagnosis not present

## 2024-04-13 DIAGNOSIS — G8929 Other chronic pain: Secondary | ICD-10-CM

## 2024-04-13 DIAGNOSIS — I10 Essential (primary) hypertension: Secondary | ICD-10-CM

## 2024-04-13 DIAGNOSIS — I700 Atherosclerosis of aorta: Secondary | ICD-10-CM

## 2024-04-13 DIAGNOSIS — E291 Testicular hypofunction: Secondary | ICD-10-CM | POA: Insufficient documentation

## 2024-04-13 DIAGNOSIS — M25561 Pain in right knee: Secondary | ICD-10-CM | POA: Insufficient documentation

## 2024-04-13 DIAGNOSIS — J4520 Mild intermittent asthma, uncomplicated: Secondary | ICD-10-CM | POA: Diagnosis not present

## 2024-04-13 DIAGNOSIS — E782 Mixed hyperlipidemia: Secondary | ICD-10-CM

## 2024-04-13 MED ORDER — CELECOXIB 100 MG PO CAPS: 100.0000 mg | ORAL_CAPSULE | Freq: Two times a day (BID) | ORAL | 0 refills | Status: AC

## 2024-04-13 NOTE — Progress Notes (Signed)
 "  BP 120/76   Pulse 91   Temp 98 F (36.7 C)   Resp 18   Ht 5' 11 (1.803 m)   Wt 180 lb 8 oz (81.9 kg)   SpO2 96%   BMI 25.17 kg/m    Subjective:    Patient ID: Lennex Pietila, male    DOB: 1955/11/29, 69 y.o.   MRN: [id number]  HPI: Aloys Hupfer is a 69 y.o. male  Chief Complaint  Patient presents with   Medical Management of Chronic Issues   Knee Pain    On right on right side of knee onset 1 month   Discussed the use of AI scribe software for clinical note transcription with the patient, who gave verbal consent to proceed.  History of Present Illness Efrem Pitstick is a 69 year old male who presents for a six month follow-up.  Right knee pain - Right knee pain for over one month - Pain localized to one spot on the knee - Pain worsened by walking - No recent trauma or falls - Uses topical blue gel, possibly Voltaren, with some relief - Takes Aleve for pain management  Hypertension - Managed with amlodipine  10 mg daily BP Readings from Last 3 Encounters:  04/13/24 120/76  12/25/23 128/71  11/05/23 120/66     Pulmonary emphysema and asthma - Uses albuterol  inhaler as needed - Uses Advair twice daily  Hyperlipidemia - On rosuvastatin  10 mg daily - Last lipid panel in July 2025: LDL 106, triglycerides 161, total cholesterol 209  Prediabetes - Last A1c 6.6 - Consumes sweets - Aims to keep A1c below 7 through dietary management  Hypogonadism and erectile dysfunction - Uses topical testosterone  - Takes sildenafil  100 mg as needed  Colorectal cancer screening - Recent Cologuard screening negative         04/13/2024    9:41 AM 05/02/2023    8:28 AM 10/30/2022    7:58 AM  Depression screen PHQ 2/9  Decreased Interest 0 0 0  Down, Depressed, Hopeless 0 1 0  PHQ - 2 Score 0 1 0  Altered sleeping 0 2   Tired, decreased energy 0 2   Change in appetite 0 0   Feeling bad or failure about yourself  0 1   Trouble concentrating 0 0   Moving slowly  or fidgety/restless 0 0   Suicidal thoughts 0 1   PHQ-9 Score 0 7    Difficult doing work/chores Not difficult at all       Data saved with a previous flowsheet row definition    Relevant past medical, surgical, family and social history reviewed and updated as indicated. Interim medical history since our last visit reviewed. Allergies and medications reviewed and updated.  Review of Systems  Ten systems reviewed and is negative except as mentioned in HPI      Objective:      BP 120/76   Pulse 91   Temp 98 F (36.7 C)   Resp 18   Ht 5' 11 (1.803 m)   Wt 180 lb 8 oz (81.9 kg)   SpO2 96%   BMI 25.17 kg/m    Wt Readings from Last 3 Encounters:  04/13/24 180 lb 8 oz (81.9 kg)  11/05/23 178 lb (80.7 kg)  10/10/23 178 lb 4.8 oz (80.9 kg)    Physical Exam VITALS: BP- 120/76 MEASUREMENTS: Weight- 180. GENERAL: Alert, cooperative, well developed, no acute distress. HEENT: Normocephalic, normal oropharynx, moist mucous membranes. CHEST: Clear to auscultation bilaterally,  no wheezes, rhonchi, or crackles. CARDIOVASCULAR: Normal heart rate and rhythm, S1 and S2 normal without murmurs. ABDOMEN: Soft, non-tender, non-distended, without organomegaly, normal bowel sounds. EXTREMITIES: No cyanosis or edema. MUSCULOSKELETAL: Right knee tenderness on palpation. NEUROLOGICAL: Cranial nerves grossly intact, moves all extremities without gross motor or sensory deficit.  Results for orders placed or performed in visit on 03/24/24  Testosterone    Collection Time: 03/24/24  9:19 AM  Result Value Ref Range   Testosterone  404 264 - 916 ng/dL  PSA   Collection Time: 03/24/24  9:19 AM  Result Value Ref Range   Prostate Specific Ag, Serum 3.8 0.0 - 4.0 ng/mL  Hemoglobin and hematocrit, blood   Collection Time: 03/24/24  9:19 AM  Result Value Ref Range   Hemoglobin 16.8 13.0 - 17.7 g/dL   Hematocrit 49.5 62.4 - 51.0 %          Assessment & Plan:   Problem List Items Addressed  This Visit       Cardiovascular and Mediastinum   Essential hypertension - Primary   Relevant Orders   CBC with Differential/Platelet   Comprehensive metabolic panel with GFR   Aortic atherosclerosis   Relevant Orders   Lipid panel     Respiratory   Mild intermittent asthma without complication   Pulmonary emphysema (HCC)     Endocrine   Hypogonadism in male     Other   Prediabetes   Relevant Orders   Comprehensive metabolic panel with GFR   Hemoglobin A1c   Erectile dysfunction   HLD (hyperlipidemia)   Relevant Orders   Lipid panel   Other Visit Diagnoses       Chronic pain of right knee       Relevant Medications   celecoxib  (CELEBREX ) 100 MG capsule   Other Relevant Orders   DG Knee 1-2 Views Right        Assessment and Plan Assessment & Plan Chronic right knee pain Over a month, exacerbated by walking. No trauma or fluid accumulation. Pain localized to one spot, not tender to palpation. - Ordered right knee x-ray - Continue Voltaren gel for pain management - Prescribed celebrex  for pain management  Essential hypertension Blood pressure well-controlled at 120/76 mmHg on current medication regimen. - Managed with amlodipine  10 mg daily  Aortic atherosclerosis Last lipid panel in July 2025 showed LDL 106 mg/dL, triglycerides 838 mg/dL, total cholesterol 790 mg/dL. Currently on rosuvastatin  10 mg daily.  Pulmonary emphysema - Uses albuterol  inhaler as needed - Uses Advair twice daily  Mild intermittent asthma Asthma managed with albuterol  inhaler as needed. - Uses albuterol  inhaler as needed - Uses Advair twice daily  Mixed hyperlipidemia Last lipid panel in July 2025 showed LDL 106 mg/dL, triglycerides 838 mg/dL, total cholesterol 790 mg/dL. Currently on rosuvastatin  10 mg daily.  Prediabetes Previous A1c was 6.6%, which is technically diabetic, but not screaming diabetes without rechecking it. Plan to recheck A1c today. Emphasis on dietary  modifications to maintain A1c below 7%. - Ordered hemoglobin A1c test - Advised dietary modifications to reduce sugar intake  Hypogonadism/ED -managed by urology -on testosterone  and sildenafil  prn      Follow up plan: Return in about 6 months (around 10/11/2024) for follow up. "

## 2024-04-14 LAB — CBC WITH DIFFERENTIAL/PLATELET
Absolute Lymphocytes: 1663 {cells}/uL (ref 850–3900)
Absolute Monocytes: 836 {cells}/uL (ref 200–950)
Basophils Absolute: 70 {cells}/uL (ref 0–200)
Basophils Relative: 0.8 %
Eosinophils Absolute: 229 {cells}/uL (ref 15–500)
Eosinophils Relative: 2.6 %
HCT: 49.3 % (ref 39.4–51.1)
Hemoglobin: 16 g/dL (ref 13.2–17.1)
MCH: 29.8 pg (ref 27.0–33.0)
MCHC: 32.5 g/dL (ref 31.6–35.4)
MCV: 91.8 fL (ref 81.4–101.7)
MPV: 10 fL (ref 7.5–12.5)
Monocytes Relative: 9.5 %
Neutro Abs: 6002 {cells}/uL (ref 1500–7800)
Neutrophils Relative %: 68.2 %
Platelets: 287 Thousand/uL (ref 140–400)
RBC: 5.37 Million/uL (ref 4.20–5.80)
RDW: 12.6 % (ref 11.0–15.0)
Total Lymphocyte: 18.9 %
WBC: 8.8 Thousand/uL (ref 3.8–10.8)

## 2024-04-14 LAB — COMPREHENSIVE METABOLIC PANEL WITH GFR
AG Ratio: 2 (calc) (ref 1.0–2.5)
ALT: 9 U/L (ref 9–46)
AST: 14 U/L (ref 10–35)
Albumin: 4.5 g/dL (ref 3.6–5.1)
Alkaline phosphatase (APISO): 74 U/L (ref 35–144)
BUN: 11 mg/dL (ref 7–25)
CO2: 29 mmol/L (ref 20–32)
Calcium: 9.8 mg/dL (ref 8.6–10.3)
Chloride: 106 mmol/L (ref 98–110)
Creat: 0.92 mg/dL (ref 0.70–1.35)
Globulin: 2.3 g/dL (ref 1.9–3.7)
Glucose, Bld: 102 mg/dL — ABNORMAL HIGH (ref 65–99)
Potassium: 4.5 mmol/L (ref 3.5–5.3)
Sodium: 141 mmol/L (ref 135–146)
Total Bilirubin: 0.4 mg/dL (ref 0.2–1.2)
Total Protein: 6.8 g/dL (ref 6.1–8.1)
eGFR: 90 mL/min/1.73m2

## 2024-04-14 LAB — HEMOGLOBIN A1C
Hgb A1c MFr Bld: 6.3 % — ABNORMAL HIGH
Mean Plasma Glucose: 134 mg/dL
eAG (mmol/L): 7.4 mmol/L

## 2024-04-14 LAB — LIPID PANEL
Cholesterol: 177 mg/dL
HDL: 61 mg/dL
LDL Cholesterol (Calc): 92 mg/dL
Non-HDL Cholesterol (Calc): 116 mg/dL
Total CHOL/HDL Ratio: 2.9 (calc)
Triglycerides: 137 mg/dL

## 2024-06-29 ENCOUNTER — Other Ambulatory Visit
